# Patient Record
Sex: Female | Born: 1940 | Race: White | Hispanic: No | Smoking: Former smoker
Health system: Southern US, Community
[De-identification: ages and names within clinical notes are randomized; demographics above are authoritative.]

## PROBLEM LIST (undated history)

## (undated) DIAGNOSIS — M25371 Other instability, right ankle: Secondary | ICD-10-CM

## (undated) DIAGNOSIS — Z96652 Presence of left artificial knee joint: Secondary | ICD-10-CM

## (undated) DIAGNOSIS — D510 Vitamin B12 deficiency anemia due to intrinsic factor deficiency: Secondary | ICD-10-CM

## (undated) DIAGNOSIS — E538 Deficiency of other specified B group vitamins: Secondary | ICD-10-CM

## (undated) DIAGNOSIS — M1710 Unilateral primary osteoarthritis, unspecified knee: Secondary | ICD-10-CM

## (undated) DIAGNOSIS — R0989 Other specified symptoms and signs involving the circulatory and respiratory systems: Secondary | ICD-10-CM

## (undated) DIAGNOSIS — R079 Chest pain, unspecified: Secondary | ICD-10-CM

## (undated) DIAGNOSIS — H8110 Benign paroxysmal vertigo, unspecified ear: Secondary | ICD-10-CM

## (undated) DIAGNOSIS — E7800 Pure hypercholesterolemia, unspecified: Secondary | ICD-10-CM

## (undated) DIAGNOSIS — K219 Gastro-esophageal reflux disease without esophagitis: Secondary | ICD-10-CM

## (undated) DIAGNOSIS — M25473 Effusion, unspecified ankle: Secondary | ICD-10-CM

## (undated) DIAGNOSIS — M1990 Unspecified osteoarthritis, unspecified site: Secondary | ICD-10-CM

## (undated) DIAGNOSIS — I10 Essential (primary) hypertension: Secondary | ICD-10-CM

## (undated) DIAGNOSIS — M2170 Unequal limb length (acquired), unspecified site: Secondary | ICD-10-CM

## (undated) DIAGNOSIS — G609 Hereditary and idiopathic neuropathy, unspecified: Secondary | ICD-10-CM

## (undated) DIAGNOSIS — E041 Nontoxic single thyroid nodule: Secondary | ICD-10-CM

## (undated) DIAGNOSIS — S62109A Fracture of unspecified carpal bone, unspecified wrist, initial encounter for closed fracture: Secondary | ICD-10-CM

## (undated) DIAGNOSIS — M5412 Radiculopathy, cervical region: Secondary | ICD-10-CM

## (undated) DIAGNOSIS — M169 Osteoarthritis of hip, unspecified: Secondary | ICD-10-CM

## (undated) DIAGNOSIS — I499 Cardiac arrhythmia, unspecified: Secondary | ICD-10-CM

## (undated) DIAGNOSIS — R1310 Dysphagia, unspecified: Secondary | ICD-10-CM

## (undated) DIAGNOSIS — N183 Chronic kidney disease, stage 3 (moderate): Secondary | ICD-10-CM

## (undated) DIAGNOSIS — E785 Hyperlipidemia, unspecified: Secondary | ICD-10-CM

## (undated) DIAGNOSIS — E669 Obesity, unspecified: Secondary | ICD-10-CM

## (undated) DIAGNOSIS — G9589 Other specified diseases of spinal cord: Secondary | ICD-10-CM

## (undated) DIAGNOSIS — R739 Hyperglycemia, unspecified: Secondary | ICD-10-CM

## (undated) DIAGNOSIS — I441 Atrioventricular block, second degree: Secondary | ICD-10-CM

## (undated) HISTORY — DX: Pure hypercholesterolemia, unspecified: E78.00

## (undated) HISTORY — DX: Other instability, right ankle: M25.371

## (undated) HISTORY — DX: Deficiency of other specified B group vitamins: E53.8

## (undated) HISTORY — DX: Other specified symptoms and signs involving the circulatory and respiratory systems: R09.89

## (undated) HISTORY — DX: Effusion, unspecified ankle: M25.473

## (undated) HISTORY — DX: Hyperlipidemia, unspecified: E78.5

## (undated) HISTORY — DX: Dysphagia, unspecified: R13.10

## (undated) HISTORY — DX: Osteoarthritis of hip, unspecified: M16.9

## (undated) HISTORY — DX: Essential (primary) hypertension: I10

## (undated) HISTORY — DX: Hyperglycemia, unspecified: R73.9

## (undated) HISTORY — DX: Unequal limb length (acquired), unspecified site: M21.70

## (undated) HISTORY — DX: Presence of left artificial knee joint: Z96.652

## (undated) HISTORY — DX: Radiculopathy, cervical region: M54.12

## (undated) HISTORY — DX: Obesity, unspecified: E66.9

## (undated) HISTORY — DX: Chronic kidney disease, stage 3 (moderate): N18.3

## (undated) HISTORY — DX: Unspecified osteoarthritis, unspecified site: M19.90

## (undated) HISTORY — DX: Gastro-esophageal reflux disease without esophagitis: K21.9

## (undated) HISTORY — PX: ABDOMINAL HYSTERECTOMY: SHX81

## (undated) HISTORY — DX: Cardiac arrhythmia, unspecified: I49.9

## (undated) HISTORY — DX: Unilateral primary osteoarthritis, unspecified knee: M17.10

## (undated) HISTORY — DX: Atrioventricular block, second degree: I44.1

## (undated) HISTORY — DX: Vitamin B12 deficiency anemia due to intrinsic factor deficiency: D51.0

## (undated) HISTORY — DX: Hereditary and idiopathic neuropathy, unspecified: G60.9

## (undated) HISTORY — DX: Nontoxic single thyroid nodule: E04.1

## (undated) HISTORY — DX: Fracture of unspecified carpal bone, unspecified wrist, initial encounter for closed fracture: S62.109A

## (undated) HISTORY — DX: Other specified diseases of spinal cord: G95.89

## (undated) HISTORY — DX: Benign paroxysmal vertigo, unspecified ear: H81.10

## (undated) HISTORY — DX: Chest pain, unspecified: R07.9

---

## 2001-05-16 DIAGNOSIS — S62109A Fracture of unspecified carpal bone, unspecified wrist, initial encounter for closed fracture: Secondary | ICD-10-CM

## 2001-05-16 HISTORY — DX: Fracture of unspecified carpal bone, unspecified wrist, initial encounter for closed fracture: S62.109A

## 2007-05-17 HISTORY — PX: KNEE ARTHROSCOPY: SUR90

## 2008-01-30 ENCOUNTER — Inpatient Hospital Stay (HOSPITAL_COMMUNITY): Admission: RE | Admit: 2008-01-30 | Discharge: 2008-02-04 | Payer: Self-pay | Admitting: Orthopedic Surgery

## 2008-02-01 ENCOUNTER — Encounter (INDEPENDENT_AMBULATORY_CARE_PROVIDER_SITE_OTHER): Payer: Self-pay | Admitting: Orthopedic Surgery

## 2008-02-01 ENCOUNTER — Ambulatory Visit: Payer: Self-pay | Admitting: *Deleted

## 2008-02-01 HISTORY — PX: TOTAL HIP ARTHROPLASTY: SHX124

## 2010-11-28 NOTE — Discharge Summary (Signed)
Debra Harrison, [place] NO.:  [id number]   MEDICAL RECORD NO.:  [id number]          PATIENT TYPE:  INP   LOCATION:  5016                         FACILITY:  [facility]   PHYSICIAN:  Dyke Brackett, M.D.    DATE OF BIRTH:  Dec 12, 1940   DATE OF ADMISSION:  01/30/2008  DATE OF DISCHARGE:                               DISCHARGE SUMMARY   ADMISSION DIAGNOSIS:  Right hip osteoarthritis.   POSTOPERATIVE DIAGNOSIS:  Right hip osteoarthritis.   PROCEDURAL NOTE:  The patient underwent a right total hip replacement on  January 30, 2008, done by Dr. Madelon Lips, Kateri Plummer, PA, assisting.  Was  stable in the PACU and admitted to 5000 on January 30, 2008.   HOSPITAL COURSE:  The patient was admitted on January 30, 2008, to 5000  following a right total hip replacement.  The patient was stable and  admitted to the floor.  On January 31, 2008, the patient was rounded on by  orthopedics, was doing well at that point.  Her vitals were stable and  blood pressure a little on the lower end at 92/63 but still stable.  CBC  within normal limits and so was the patient's BMP.  The patient was  complaining of some calf pain.  They ordered Robaxin despite the fact  that the patient had an allergy to Robaxin.  That was not continued.  Still awaiting discharge to the skilled nursing facility Monday or  Tuesday.  The patient was to be evaluated by PT and OT, was  weightbearing as tolerated on the right lower extremity.  Dr. Barry Dienes was  notified about the patient's lower blood pressure and pain in the calves  and recommended Robaxin.  Once again noted that the patient had an  allergy and did not take that medicine.  The patient's blood pressure  lowered overnight on February 01, 2008, at 90/60.  Hemoglobin was 9.3.  Otherwise, lab work was within normal limits.  The patient was having  still a lot of calf pain, so we did a venous Doppler ultrasound on February 01, 2008, which came out as negative.  We continued to  monitor her calf  pain.  Due to her becoming orthostatic overnight on February 01, 2008, she  was given an IV bolus of fluid and rechecked on the morning of February 02, 2008.  The patient continued weightbearing as tolerated.  Noted on February 02, 2008, the patient had a hemoglobin of 8.8.  Decided to transfuse 2  units of packed red blood cells.  The patient tolerated that well.  We  were able to discontinue IV fluids.  Stated she was feeling well.  Continued to monitor.  Vital signs were stable following IV fluid bolus  and also CBC following transfusion of 2 units of packed red blood cells.  Noted her blood pressure was 125/70 in the morning on February 02, 2008.  On  February 03, 2008, the patient was still doing well.  Hemoglobin was up to  10.2, white blood cell 4.8, hematocrit 30.1, platelets 205.  Her vital  signs  were stable.  Her blood pressure was 122/71, and 95% oxygen on  room air.  BMP was within normal limits.  The patient's pain was very  mild.  The calf pain was improving.  As noted before, had a negative  Doppler ultrasound.  Right lower extremity neurovascularly intact.  The  patient was ready to go to the skilled nursing facility, to continue  weightbearing as tolerated on the right lower extremity.  The plan was  for discharge to skilled nursing facility on February 04, 2008, pending  vitals being stable and lab work within normal limits.   ASSESSMENT/PLAN:  The patient is 4 days postoperative right total hip  replacement with plan for discharge on postoperative day 5 as there is  apparently no bed available at skilled nursing.  The patient will be  discharged on a regular diet, will be weightbearing as tolerated on the  right lower extremity.   CONDITION:  Stable on discharge.   DISCHARGE MEDICATIONS:  1. She will be discharged on Diovan/hydrochlorothiazide 320/25 mg to      be taken 1 tablet p.o. daily.  2. Tramadol 50 mg 1 tablet p.o. q.4-6h. p.r.n. pain as needed.  3. Lovenox 40  mg subcu q.24h. at 8:00 a.m.  She will be needing that      for another 10 days, which will go to February 14, 2008, which will be      her last day.  4. The patient will continue on Colace 100 mg p.o. b.i.d.  5. Continue Percocet 5/325 mg 1 to 2 tablets p.o. q.4-6h. p.r.n. pain.  6. Continue with Senokot-S p.o. p.r.n.  7. Continue Fleet's enema p.r.n.  8. Continue with Tylenol 325 to 650 mg 1 tablet p.o. q.4-6h. p.r.n.      pain, not to exceed 4000 mg per day.  9. Continue Zofran 4 mg p.o. q.8h. p.r.n. nausea, vomiting.   Plan for followup in the office with Dr. Madelon Lips in about 2 weeks  following admission to skilled nursing facility.  Please call [phone]  for followup appointment at Dr. Candise Bowens office.  Plan for patient to  have staples removed postop day number 14, which will be about February 14, 2008.      Sharol Given, PA      Dyke Brackett, M.D.  Electronically Signed    JBS/MEDQ  D:  02/03/2008  T:  02/03/2008  Job:  [id number]

## 2010-11-28 NOTE — Op Note (Signed)
NAME:  Debra Harrison, Debra Harrison NO.:  [id number]   MEDICAL RECORD NO.:  [id number]          PATIENT TYPE:  INP   LOCATION:  5016                         FACILITY:  [facility]   PHYSICIAN:  Dyke Brackett, M.D.    DATE OF BIRTH:  Jun 19, 1941   DATE OF PROCEDURE:  DATE OF DISCHARGE:                               OPERATIVE REPORT   PREOPERATIVE DIAGNOSIS:  Severe osteoarthritis, right hip.   POSTOPERATIVE DIAGNOSIS:  Severe osteoarthritis, right hip.   OPERATION:  Right total hip replacement (AML small stature of 15-mm plus  1.5-mm neck length with 56 mL acetabulum with Pinnacle cup 10-degree  liner.   SURGEON:  Dyke Brackett, MD.   ASSISTANT:  Sharol Given, PA   BLOOD LOSS:  Approximately 300.   DESCRIPTION OF PROCEDURE:  Lateral position with a posterior approach to  the hip made splitting the iliotibial band and short external rotators  of the hip, a T-capsulotomy made in the hip.  Severe arthritis was  encountered with almost of all the effusion to the pelvis with pretty  significant erosion into the acetabulum.  Head was cut about one  fingerbreadth above the lesser troch followed by progressive reaming and  then rasping to accept a-15 mm small stature stem.  This was next  directed to the acetabulum.  There was somewhat of an oblong acetabulum  with an hourglass shape to it.  This was progressively reamed up after  the appropriate retractors were placed for initially plan of a +1-mm  reaming.  With the trial being placed, through this bottomed out and  therefore was eventually a 4-mm under reaming was done to allow some  purchase of the cup.  A 3-hole cup was used with three screws being  placed as well in the superior weightbearing aspect of the acetabulum  for better stability and a trial of poly was placed into the final cup  and then the rasp with the trial femur placed.  A 36-mm hip ball was  used with a +5-mm neck length with restored leg lengths nicely.  Excellent stability was noted with little if any tendency to dislocate.  The trials were removed followed by placement of the final poly and the  final prosthesis on the femur and then another trial carried out with  the +5 1.5-mm neck length.  The final ball was placed and excellent  stability noted.  Closure of the capsule was done with #1 Ethibond on  the fascia.  Fascial closure and then number one 2-0 Vicryl on the  subcutaneous tissues.  Lightly compressive sterile dressing applied and  taken to recovery room in stable condition.      Dyke Brackett, M.D.  Electronically Signed     WDC/MEDQ  D:  01/30/2008  T:  01/31/2008  Job:  [id number]

## 2011-04-12 LAB — URINALYSIS, ROUTINE W REFLEX MICROSCOPIC
Bilirubin Urine: NEGATIVE
Glucose, UA: NEGATIVE
Hgb urine dipstick: NEGATIVE
Protein, ur: NEGATIVE
Urobilinogen, UA: 0.2

## 2011-04-12 LAB — URINE CULTURE: Colony Count: >=100000

## 2011-04-12 LAB — URINE MICROSCOPIC-ADD ON

## 2011-04-12 LAB — TYPE AND SCREEN
DAT, IgG: NEGATIVE
Donor AG Type: NEGATIVE

## 2011-04-12 LAB — DIFFERENTIAL
Basophils Relative: 0
Eosinophils Absolute: 0.1
Eosinophils Relative: 1
Neutrophils Relative %: 66

## 2011-04-12 LAB — CBC
Hemoglobin: 13.2
MCHC: 34.7
RBC: 4.13
WBC: 5.2

## 2011-04-12 LAB — COMPREHENSIVE METABOLIC PANEL
ALT: 16
Alkaline Phosphatase: 65
CO2: 28
Calcium: 10.1
Chloride: 104
GFR calc non Af Amer: >60
Glucose, Bld: 109 — ABNORMAL HIGH
Potassium: 3.9
Sodium: 140
Total Bilirubin: 0.8

## 2011-04-12 LAB — PROTIME-INR
INR: 1
Prothrombin Time: 12.9

## 2011-04-13 LAB — BASIC METABOLIC PANEL
BUN: 8
BUN: 9
CO2: 29
CO2: 33 — ABNORMAL HIGH
Calcium: 8.7
Calcium: 8.7
Chloride: 102
Chloride: 102
Chloride: 105
Chloride: 106
Creatinine, Ser: 0.61
Creatinine, Ser: 0.72
Creatinine, Ser: 0.75
GFR calc Af Amer: >60
GFR calc Af Amer: >60
GFR calc Af Amer: >60
Glucose, Bld: 127 — ABNORMAL HIGH
Potassium: 4.2
Potassium: 4.3
Sodium: 140

## 2011-04-13 LAB — CBC
HCT: 28 — ABNORMAL LOW
HCT: 29.7 — ABNORMAL LOW
HCT: 30.1 — ABNORMAL LOW
Hemoglobin: 10.2 — ABNORMAL LOW
Hemoglobin: 8.8 — ABNORMAL LOW
MCHC: 34.4
MCHC: 35.1
MCHC: 35.5
MCV: 89.6
MCV: 91
MCV: 91.9
MCV: 92.2
Platelets: 190
Platelets: 225
Platelets: 236
RBC: 2.79 — ABNORMAL LOW
RBC: 2.94 — ABNORMAL LOW
RBC: 3.31 — ABNORMAL LOW
RDW: 13.1
RDW: 14.1
WBC: 4.8
WBC: 5.4
WBC: 5.8
WBC: 5.9

## 2011-04-13 LAB — TYPE AND SCREEN
ABO/RH(D): O NEG
Antibody Screen: POSITIVE
Donor AG Type: NEGATIVE

## 2011-04-13 LAB — PREPARE RBC (CROSSMATCH)

## 2011-11-30 DIAGNOSIS — R079 Chest pain, unspecified: Secondary | ICD-10-CM | POA: Diagnosis not present

## 2011-12-25 DIAGNOSIS — M7750 Other enthesopathy of unspecified foot: Secondary | ICD-10-CM | POA: Diagnosis not present

## 2011-12-25 DIAGNOSIS — M722 Plantar fascial fibromatosis: Secondary | ICD-10-CM | POA: Diagnosis not present

## 2012-01-15 DIAGNOSIS — M7750 Other enthesopathy of unspecified foot: Secondary | ICD-10-CM | POA: Diagnosis not present

## 2012-01-15 DIAGNOSIS — M722 Plantar fascial fibromatosis: Secondary | ICD-10-CM | POA: Diagnosis not present

## 2012-04-22 DIAGNOSIS — Z79899 Other long term (current) drug therapy: Secondary | ICD-10-CM | POA: Diagnosis not present

## 2012-04-22 DIAGNOSIS — E7800 Pure hypercholesterolemia, unspecified: Secondary | ICD-10-CM | POA: Diagnosis not present

## 2012-04-22 DIAGNOSIS — Z23 Encounter for immunization: Secondary | ICD-10-CM | POA: Diagnosis not present

## 2012-04-22 DIAGNOSIS — Z1239 Encounter for other screening for malignant neoplasm of breast: Secondary | ICD-10-CM | POA: Diagnosis not present

## 2012-04-22 DIAGNOSIS — I10 Essential (primary) hypertension: Secondary | ICD-10-CM | POA: Diagnosis not present

## 2012-04-30 DIAGNOSIS — Z1231 Encounter for screening mammogram for malignant neoplasm of breast: Secondary | ICD-10-CM | POA: Diagnosis not present

## 2012-12-18 DIAGNOSIS — I10 Essential (primary) hypertension: Secondary | ICD-10-CM | POA: Diagnosis not present

## 2012-12-18 DIAGNOSIS — R609 Edema, unspecified: Secondary | ICD-10-CM | POA: Diagnosis not present

## 2012-12-18 DIAGNOSIS — Z79899 Other long term (current) drug therapy: Secondary | ICD-10-CM | POA: Diagnosis not present

## 2013-01-01 DIAGNOSIS — N183 Chronic kidney disease, stage 3 unspecified: Secondary | ICD-10-CM | POA: Diagnosis not present

## 2013-01-01 DIAGNOSIS — R609 Edema, unspecified: Secondary | ICD-10-CM | POA: Diagnosis not present

## 2013-01-01 DIAGNOSIS — I10 Essential (primary) hypertension: Secondary | ICD-10-CM | POA: Diagnosis not present

## 2013-01-02 DIAGNOSIS — E7800 Pure hypercholesterolemia, unspecified: Secondary | ICD-10-CM | POA: Diagnosis not present

## 2013-01-02 DIAGNOSIS — I10 Essential (primary) hypertension: Secondary | ICD-10-CM | POA: Diagnosis not present

## 2013-01-07 DIAGNOSIS — N281 Cyst of kidney, acquired: Secondary | ICD-10-CM | POA: Diagnosis not present

## 2013-01-07 DIAGNOSIS — R1011 Right upper quadrant pain: Secondary | ICD-10-CM | POA: Diagnosis not present

## 2013-01-19 DIAGNOSIS — M949 Disorder of cartilage, unspecified: Secondary | ICD-10-CM | POA: Diagnosis not present

## 2013-01-19 DIAGNOSIS — Z780 Asymptomatic menopausal state: Secondary | ICD-10-CM | POA: Diagnosis not present

## 2013-01-19 DIAGNOSIS — I10 Essential (primary) hypertension: Secondary | ICD-10-CM | POA: Diagnosis not present

## 2013-01-19 DIAGNOSIS — M899 Disorder of bone, unspecified: Secondary | ICD-10-CM | POA: Diagnosis not present

## 2013-01-19 DIAGNOSIS — Z23 Encounter for immunization: Secondary | ICD-10-CM | POA: Diagnosis not present

## 2013-01-19 DIAGNOSIS — Z79899 Other long term (current) drug therapy: Secondary | ICD-10-CM | POA: Diagnosis not present

## 2013-01-19 DIAGNOSIS — R609 Edema, unspecified: Secondary | ICD-10-CM | POA: Diagnosis not present

## 2013-02-16 DIAGNOSIS — M1710 Unilateral primary osteoarthritis, unspecified knee: Secondary | ICD-10-CM | POA: Diagnosis not present

## 2013-02-23 DIAGNOSIS — M1710 Unilateral primary osteoarthritis, unspecified knee: Secondary | ICD-10-CM | POA: Diagnosis not present

## 2013-03-02 DIAGNOSIS — M1710 Unilateral primary osteoarthritis, unspecified knee: Secondary | ICD-10-CM | POA: Diagnosis not present

## 2013-05-19 DIAGNOSIS — Z1231 Encounter for screening mammogram for malignant neoplasm of breast: Secondary | ICD-10-CM | POA: Diagnosis not present

## 2013-08-06 DIAGNOSIS — R062 Wheezing: Secondary | ICD-10-CM | POA: Diagnosis not present

## 2013-08-06 DIAGNOSIS — J209 Acute bronchitis, unspecified: Secondary | ICD-10-CM | POA: Diagnosis not present

## 2013-10-07 DIAGNOSIS — R079 Chest pain, unspecified: Secondary | ICD-10-CM | POA: Diagnosis not present

## 2014-04-13 DIAGNOSIS — M722 Plantar fascial fibromatosis: Secondary | ICD-10-CM | POA: Diagnosis not present

## 2014-04-13 DIAGNOSIS — M6240 Contracture of muscle, unspecified site: Secondary | ICD-10-CM | POA: Diagnosis not present

## 2014-05-18 DIAGNOSIS — M722 Plantar fascial fibromatosis: Secondary | ICD-10-CM | POA: Diagnosis not present

## 2014-05-18 DIAGNOSIS — M6702 Short Achilles tendon (acquired), left ankle: Secondary | ICD-10-CM | POA: Diagnosis not present

## 2014-08-24 DIAGNOSIS — I10 Essential (primary) hypertension: Secondary | ICD-10-CM | POA: Diagnosis not present

## 2014-08-24 DIAGNOSIS — Z79899 Other long term (current) drug therapy: Secondary | ICD-10-CM | POA: Diagnosis not present

## 2014-08-24 DIAGNOSIS — E669 Obesity, unspecified: Secondary | ICD-10-CM | POA: Diagnosis not present

## 2014-08-24 DIAGNOSIS — E780 Pure hypercholesterolemia: Secondary | ICD-10-CM | POA: Diagnosis not present

## 2014-08-24 DIAGNOSIS — Z0000 Encounter for general adult medical examination without abnormal findings: Secondary | ICD-10-CM | POA: Diagnosis not present

## 2014-08-24 DIAGNOSIS — Z23 Encounter for immunization: Secondary | ICD-10-CM | POA: Diagnosis not present

## 2014-08-31 DIAGNOSIS — Z1231 Encounter for screening mammogram for malignant neoplasm of breast: Secondary | ICD-10-CM | POA: Diagnosis not present

## 2014-10-07 DIAGNOSIS — M170 Bilateral primary osteoarthritis of knee: Secondary | ICD-10-CM | POA: Diagnosis not present

## 2014-10-14 DIAGNOSIS — M170 Bilateral primary osteoarthritis of knee: Secondary | ICD-10-CM | POA: Diagnosis not present

## 2014-10-21 DIAGNOSIS — M170 Bilateral primary osteoarthritis of knee: Secondary | ICD-10-CM | POA: Diagnosis not present

## 2014-11-08 DIAGNOSIS — N183 Chronic kidney disease, stage 3 (moderate): Secondary | ICD-10-CM | POA: Diagnosis not present

## 2014-11-08 DIAGNOSIS — I10 Essential (primary) hypertension: Secondary | ICD-10-CM | POA: Diagnosis not present

## 2015-01-31 DIAGNOSIS — I10 Essential (primary) hypertension: Secondary | ICD-10-CM | POA: Diagnosis not present

## 2015-01-31 DIAGNOSIS — R312 Other microscopic hematuria: Secondary | ICD-10-CM | POA: Diagnosis not present

## 2015-01-31 DIAGNOSIS — R1084 Generalized abdominal pain: Secondary | ICD-10-CM | POA: Diagnosis not present

## 2015-01-31 DIAGNOSIS — R1310 Dysphagia, unspecified: Secondary | ICD-10-CM | POA: Diagnosis not present

## 2015-02-04 DIAGNOSIS — N281 Cyst of kidney, acquired: Secondary | ICD-10-CM | POA: Diagnosis not present

## 2015-02-04 DIAGNOSIS — R1011 Right upper quadrant pain: Secondary | ICD-10-CM | POA: Diagnosis not present

## 2015-12-16 DIAGNOSIS — R739 Hyperglycemia, unspecified: Secondary | ICD-10-CM | POA: Diagnosis not present

## 2015-12-16 DIAGNOSIS — Z79899 Other long term (current) drug therapy: Secondary | ICD-10-CM | POA: Diagnosis not present

## 2015-12-16 DIAGNOSIS — N183 Chronic kidney disease, stage 3 (moderate): Secondary | ICD-10-CM | POA: Diagnosis not present

## 2015-12-16 DIAGNOSIS — I10 Essential (primary) hypertension: Secondary | ICD-10-CM | POA: Diagnosis not present

## 2015-12-16 DIAGNOSIS — E785 Hyperlipidemia, unspecified: Secondary | ICD-10-CM | POA: Diagnosis not present

## 2015-12-16 DIAGNOSIS — K219 Gastro-esophageal reflux disease without esophagitis: Secondary | ICD-10-CM | POA: Diagnosis not present

## 2015-12-23 DIAGNOSIS — Z1231 Encounter for screening mammogram for malignant neoplasm of breast: Secondary | ICD-10-CM | POA: Diagnosis not present

## 2016-01-23 DIAGNOSIS — M7061 Trochanteric bursitis, right hip: Secondary | ICD-10-CM | POA: Diagnosis not present

## 2016-01-23 DIAGNOSIS — M1712 Unilateral primary osteoarthritis, left knee: Secondary | ICD-10-CM | POA: Diagnosis not present

## 2016-01-30 DIAGNOSIS — M1712 Unilateral primary osteoarthritis, left knee: Secondary | ICD-10-CM | POA: Diagnosis not present

## 2016-02-21 DIAGNOSIS — M7062 Trochanteric bursitis, left hip: Secondary | ICD-10-CM | POA: Diagnosis not present

## 2016-02-28 DIAGNOSIS — M7061 Trochanteric bursitis, right hip: Secondary | ICD-10-CM | POA: Diagnosis not present

## 2016-02-28 DIAGNOSIS — M1712 Unilateral primary osteoarthritis, left knee: Secondary | ICD-10-CM | POA: Diagnosis not present

## 2016-02-28 DIAGNOSIS — M25552 Pain in left hip: Secondary | ICD-10-CM | POA: Diagnosis not present

## 2016-03-20 DIAGNOSIS — M1712 Unilateral primary osteoarthritis, left knee: Secondary | ICD-10-CM | POA: Diagnosis not present

## 2016-03-20 DIAGNOSIS — M7061 Trochanteric bursitis, right hip: Secondary | ICD-10-CM | POA: Diagnosis not present

## 2016-03-20 DIAGNOSIS — M25552 Pain in left hip: Secondary | ICD-10-CM | POA: Diagnosis not present

## 2016-03-26 DIAGNOSIS — M1712 Unilateral primary osteoarthritis, left knee: Secondary | ICD-10-CM | POA: Diagnosis not present

## 2016-03-26 DIAGNOSIS — M25552 Pain in left hip: Secondary | ICD-10-CM | POA: Diagnosis not present

## 2016-03-26 DIAGNOSIS — M7061 Trochanteric bursitis, right hip: Secondary | ICD-10-CM | POA: Diagnosis not present

## 2016-05-01 DIAGNOSIS — M1712 Unilateral primary osteoarthritis, left knee: Secondary | ICD-10-CM | POA: Diagnosis not present

## 2016-05-02 DIAGNOSIS — Z01818 Encounter for other preprocedural examination: Secondary | ICD-10-CM | POA: Diagnosis not present

## 2016-05-02 DIAGNOSIS — M79609 Pain in unspecified limb: Secondary | ICD-10-CM | POA: Diagnosis not present

## 2016-05-02 DIAGNOSIS — Z79899 Other long term (current) drug therapy: Secondary | ICD-10-CM | POA: Diagnosis not present

## 2016-05-02 DIAGNOSIS — Z79890 Hormone replacement therapy (postmenopausal): Secondary | ICD-10-CM | POA: Diagnosis not present

## 2016-05-02 DIAGNOSIS — R52 Pain, unspecified: Secondary | ICD-10-CM | POA: Diagnosis not present

## 2016-05-09 DIAGNOSIS — M1712 Unilateral primary osteoarthritis, left knee: Secondary | ICD-10-CM | POA: Diagnosis not present

## 2016-05-09 DIAGNOSIS — Z9181 History of falling: Secondary | ICD-10-CM | POA: Diagnosis not present

## 2016-05-21 DIAGNOSIS — M1712 Unilateral primary osteoarthritis, left knee: Secondary | ICD-10-CM | POA: Diagnosis not present

## 2016-05-30 DIAGNOSIS — E7800 Pure hypercholesterolemia, unspecified: Secondary | ICD-10-CM | POA: Diagnosis present

## 2016-05-30 DIAGNOSIS — I10 Essential (primary) hypertension: Secondary | ICD-10-CM | POA: Diagnosis present

## 2016-05-30 DIAGNOSIS — Z96652 Presence of left artificial knee joint: Secondary | ICD-10-CM | POA: Diagnosis not present

## 2016-05-30 DIAGNOSIS — Z79899 Other long term (current) drug therapy: Secondary | ICD-10-CM | POA: Diagnosis not present

## 2016-05-30 DIAGNOSIS — G8918 Other acute postprocedural pain: Secondary | ICD-10-CM | POA: Diagnosis not present

## 2016-05-30 DIAGNOSIS — Z471 Aftercare following joint replacement surgery: Secondary | ICD-10-CM | POA: Diagnosis not present

## 2016-05-30 DIAGNOSIS — E669 Obesity, unspecified: Secondary | ICD-10-CM | POA: Diagnosis present

## 2016-05-30 DIAGNOSIS — Z6833 Body mass index (BMI) 33.0-33.9, adult: Secondary | ICD-10-CM | POA: Diagnosis not present

## 2016-05-30 DIAGNOSIS — Z87891 Personal history of nicotine dependence: Secondary | ICD-10-CM | POA: Diagnosis not present

## 2016-05-30 DIAGNOSIS — M1712 Unilateral primary osteoarthritis, left knee: Secondary | ICD-10-CM | POA: Diagnosis not present

## 2016-05-30 DIAGNOSIS — Z23 Encounter for immunization: Secondary | ICD-10-CM | POA: Diagnosis not present

## 2016-05-30 DIAGNOSIS — D649 Anemia, unspecified: Secondary | ICD-10-CM | POA: Diagnosis present

## 2016-05-30 HISTORY — PX: OTHER SURGICAL HISTORY: SHX169

## 2016-06-02 DIAGNOSIS — Z79899 Other long term (current) drug therapy: Secondary | ICD-10-CM | POA: Diagnosis not present

## 2016-06-02 DIAGNOSIS — I10 Essential (primary) hypertension: Secondary | ICD-10-CM | POA: Diagnosis not present

## 2016-06-02 DIAGNOSIS — E669 Obesity, unspecified: Secondary | ICD-10-CM | POA: Diagnosis not present

## 2016-06-02 DIAGNOSIS — D649 Anemia, unspecified: Secondary | ICD-10-CM | POA: Diagnosis not present

## 2016-06-02 DIAGNOSIS — Z6833 Body mass index (BMI) 33.0-33.9, adult: Secondary | ICD-10-CM | POA: Diagnosis not present

## 2016-06-02 DIAGNOSIS — R262 Difficulty in walking, not elsewhere classified: Secondary | ICD-10-CM | POA: Diagnosis not present

## 2016-06-02 DIAGNOSIS — Z471 Aftercare following joint replacement surgery: Secondary | ICD-10-CM | POA: Diagnosis not present

## 2016-06-02 DIAGNOSIS — Z87891 Personal history of nicotine dependence: Secondary | ICD-10-CM | POA: Diagnosis not present

## 2016-06-02 DIAGNOSIS — G8918 Other acute postprocedural pain: Secondary | ICD-10-CM | POA: Diagnosis not present

## 2016-06-02 DIAGNOSIS — M1712 Unilateral primary osteoarthritis, left knee: Secondary | ICD-10-CM | POA: Diagnosis not present

## 2016-06-02 DIAGNOSIS — Z23 Encounter for immunization: Secondary | ICD-10-CM | POA: Diagnosis not present

## 2016-06-02 DIAGNOSIS — E7800 Pure hypercholesterolemia, unspecified: Secondary | ICD-10-CM | POA: Diagnosis not present

## 2016-06-02 DIAGNOSIS — Z96652 Presence of left artificial knee joint: Secondary | ICD-10-CM | POA: Diagnosis not present

## 2016-06-03 DIAGNOSIS — G8918 Other acute postprocedural pain: Secondary | ICD-10-CM | POA: Diagnosis not present

## 2016-06-03 DIAGNOSIS — R262 Difficulty in walking, not elsewhere classified: Secondary | ICD-10-CM | POA: Diagnosis not present

## 2016-06-03 DIAGNOSIS — Z471 Aftercare following joint replacement surgery: Secondary | ICD-10-CM | POA: Diagnosis not present

## 2016-06-03 DIAGNOSIS — D649 Anemia, unspecified: Secondary | ICD-10-CM | POA: Diagnosis not present

## 2016-06-13 DIAGNOSIS — Z96652 Presence of left artificial knee joint: Secondary | ICD-10-CM | POA: Diagnosis not present

## 2016-06-13 DIAGNOSIS — I10 Essential (primary) hypertension: Secondary | ICD-10-CM | POA: Diagnosis not present

## 2016-06-13 DIAGNOSIS — Z471 Aftercare following joint replacement surgery: Secondary | ICD-10-CM | POA: Diagnosis not present

## 2016-06-13 DIAGNOSIS — Z7982 Long term (current) use of aspirin: Secondary | ICD-10-CM | POA: Diagnosis not present

## 2016-06-14 DIAGNOSIS — Z96652 Presence of left artificial knee joint: Secondary | ICD-10-CM | POA: Diagnosis not present

## 2016-06-14 DIAGNOSIS — Z471 Aftercare following joint replacement surgery: Secondary | ICD-10-CM | POA: Diagnosis not present

## 2016-06-14 DIAGNOSIS — Z7982 Long term (current) use of aspirin: Secondary | ICD-10-CM | POA: Diagnosis not present

## 2016-06-14 DIAGNOSIS — I10 Essential (primary) hypertension: Secondary | ICD-10-CM | POA: Diagnosis not present

## 2016-06-18 DIAGNOSIS — M5416 Radiculopathy, lumbar region: Secondary | ICD-10-CM | POA: Diagnosis not present

## 2016-06-19 DIAGNOSIS — Z96652 Presence of left artificial knee joint: Secondary | ICD-10-CM | POA: Diagnosis not present

## 2016-06-19 DIAGNOSIS — Z7982 Long term (current) use of aspirin: Secondary | ICD-10-CM | POA: Diagnosis not present

## 2016-06-19 DIAGNOSIS — I10 Essential (primary) hypertension: Secondary | ICD-10-CM | POA: Diagnosis not present

## 2016-06-19 DIAGNOSIS — Z471 Aftercare following joint replacement surgery: Secondary | ICD-10-CM | POA: Diagnosis not present

## 2016-06-20 DIAGNOSIS — Z96652 Presence of left artificial knee joint: Secondary | ICD-10-CM | POA: Diagnosis not present

## 2016-06-20 DIAGNOSIS — Z7982 Long term (current) use of aspirin: Secondary | ICD-10-CM | POA: Diagnosis not present

## 2016-06-20 DIAGNOSIS — Z471 Aftercare following joint replacement surgery: Secondary | ICD-10-CM | POA: Diagnosis not present

## 2016-06-20 DIAGNOSIS — I10 Essential (primary) hypertension: Secondary | ICD-10-CM | POA: Diagnosis not present

## 2016-06-21 DIAGNOSIS — M5416 Radiculopathy, lumbar region: Secondary | ICD-10-CM | POA: Diagnosis not present

## 2016-06-21 DIAGNOSIS — M5137 Other intervertebral disc degeneration, lumbosacral region: Secondary | ICD-10-CM | POA: Diagnosis not present

## 2016-06-21 DIAGNOSIS — M5126 Other intervertebral disc displacement, lumbar region: Secondary | ICD-10-CM | POA: Diagnosis not present

## 2016-06-21 DIAGNOSIS — M5136 Other intervertebral disc degeneration, lumbar region: Secondary | ICD-10-CM | POA: Diagnosis not present

## 2016-06-22 DIAGNOSIS — Z96652 Presence of left artificial knee joint: Secondary | ICD-10-CM | POA: Diagnosis not present

## 2016-06-22 DIAGNOSIS — Z7982 Long term (current) use of aspirin: Secondary | ICD-10-CM | POA: Diagnosis not present

## 2016-06-22 DIAGNOSIS — Z471 Aftercare following joint replacement surgery: Secondary | ICD-10-CM | POA: Diagnosis not present

## 2016-06-22 DIAGNOSIS — I10 Essential (primary) hypertension: Secondary | ICD-10-CM | POA: Diagnosis not present

## 2016-06-25 DIAGNOSIS — Z7982 Long term (current) use of aspirin: Secondary | ICD-10-CM | POA: Diagnosis not present

## 2016-06-25 DIAGNOSIS — Z471 Aftercare following joint replacement surgery: Secondary | ICD-10-CM | POA: Diagnosis not present

## 2016-06-25 DIAGNOSIS — I10 Essential (primary) hypertension: Secondary | ICD-10-CM | POA: Diagnosis not present

## 2016-06-25 DIAGNOSIS — M5416 Radiculopathy, lumbar region: Secondary | ICD-10-CM | POA: Diagnosis not present

## 2016-06-25 DIAGNOSIS — Z96652 Presence of left artificial knee joint: Secondary | ICD-10-CM | POA: Diagnosis not present

## 2016-06-26 DIAGNOSIS — Z96652 Presence of left artificial knee joint: Secondary | ICD-10-CM | POA: Diagnosis not present

## 2016-06-26 DIAGNOSIS — M25562 Pain in left knee: Secondary | ICD-10-CM | POA: Diagnosis not present

## 2016-06-27 DIAGNOSIS — I10 Essential (primary) hypertension: Secondary | ICD-10-CM | POA: Diagnosis not present

## 2016-06-27 DIAGNOSIS — Z96652 Presence of left artificial knee joint: Secondary | ICD-10-CM | POA: Diagnosis not present

## 2016-06-27 DIAGNOSIS — Z471 Aftercare following joint replacement surgery: Secondary | ICD-10-CM | POA: Diagnosis not present

## 2016-06-27 DIAGNOSIS — Z7982 Long term (current) use of aspirin: Secondary | ICD-10-CM | POA: Diagnosis not present

## 2016-06-28 DIAGNOSIS — Z96652 Presence of left artificial knee joint: Secondary | ICD-10-CM | POA: Diagnosis not present

## 2016-06-28 DIAGNOSIS — R202 Paresthesia of skin: Secondary | ICD-10-CM | POA: Diagnosis not present

## 2016-06-28 DIAGNOSIS — G5791 Unspecified mononeuropathy of right lower limb: Secondary | ICD-10-CM | POA: Diagnosis not present

## 2016-06-28 DIAGNOSIS — M170 Bilateral primary osteoarthritis of knee: Secondary | ICD-10-CM | POA: Diagnosis not present

## 2016-06-29 DIAGNOSIS — Z96652 Presence of left artificial knee joint: Secondary | ICD-10-CM | POA: Diagnosis not present

## 2016-06-29 DIAGNOSIS — R2681 Unsteadiness on feet: Secondary | ICD-10-CM | POA: Diagnosis not present

## 2016-06-29 DIAGNOSIS — M25462 Effusion, left knee: Secondary | ICD-10-CM | POA: Diagnosis not present

## 2016-06-29 DIAGNOSIS — M25562 Pain in left knee: Secondary | ICD-10-CM | POA: Diagnosis not present

## 2016-06-29 DIAGNOSIS — M6281 Muscle weakness (generalized): Secondary | ICD-10-CM | POA: Diagnosis not present

## 2016-06-29 DIAGNOSIS — R208 Other disturbances of skin sensation: Secondary | ICD-10-CM | POA: Diagnosis not present

## 2016-06-29 DIAGNOSIS — R293 Abnormal posture: Secondary | ICD-10-CM | POA: Diagnosis not present

## 2016-06-29 DIAGNOSIS — M25662 Stiffness of left knee, not elsewhere classified: Secondary | ICD-10-CM | POA: Diagnosis not present

## 2016-06-29 DIAGNOSIS — R2689 Other abnormalities of gait and mobility: Secondary | ICD-10-CM | POA: Diagnosis not present

## 2016-07-02 DIAGNOSIS — M6281 Muscle weakness (generalized): Secondary | ICD-10-CM | POA: Diagnosis not present

## 2016-07-02 DIAGNOSIS — M25462 Effusion, left knee: Secondary | ICD-10-CM | POA: Diagnosis not present

## 2016-07-02 DIAGNOSIS — Z96652 Presence of left artificial knee joint: Secondary | ICD-10-CM | POA: Diagnosis not present

## 2016-07-02 DIAGNOSIS — R208 Other disturbances of skin sensation: Secondary | ICD-10-CM | POA: Diagnosis not present

## 2016-07-02 DIAGNOSIS — R2689 Other abnormalities of gait and mobility: Secondary | ICD-10-CM | POA: Diagnosis not present

## 2016-07-02 DIAGNOSIS — M25562 Pain in left knee: Secondary | ICD-10-CM | POA: Diagnosis not present

## 2016-07-04 DIAGNOSIS — M25462 Effusion, left knee: Secondary | ICD-10-CM | POA: Diagnosis not present

## 2016-07-04 DIAGNOSIS — M25562 Pain in left knee: Secondary | ICD-10-CM | POA: Diagnosis not present

## 2016-07-04 DIAGNOSIS — R208 Other disturbances of skin sensation: Secondary | ICD-10-CM | POA: Diagnosis not present

## 2016-07-04 DIAGNOSIS — R2689 Other abnormalities of gait and mobility: Secondary | ICD-10-CM | POA: Diagnosis not present

## 2016-07-04 DIAGNOSIS — Z96652 Presence of left artificial knee joint: Secondary | ICD-10-CM | POA: Diagnosis not present

## 2016-07-04 DIAGNOSIS — M6281 Muscle weakness (generalized): Secondary | ICD-10-CM | POA: Diagnosis not present

## 2016-07-05 DIAGNOSIS — R208 Other disturbances of skin sensation: Secondary | ICD-10-CM | POA: Diagnosis not present

## 2016-07-05 DIAGNOSIS — M25462 Effusion, left knee: Secondary | ICD-10-CM | POA: Diagnosis not present

## 2016-07-05 DIAGNOSIS — Z96652 Presence of left artificial knee joint: Secondary | ICD-10-CM | POA: Diagnosis not present

## 2016-07-05 DIAGNOSIS — R2689 Other abnormalities of gait and mobility: Secondary | ICD-10-CM | POA: Diagnosis not present

## 2016-07-05 DIAGNOSIS — M6281 Muscle weakness (generalized): Secondary | ICD-10-CM | POA: Diagnosis not present

## 2016-07-05 DIAGNOSIS — M25562 Pain in left knee: Secondary | ICD-10-CM | POA: Diagnosis not present

## 2016-07-10 DIAGNOSIS — M25562 Pain in left knee: Secondary | ICD-10-CM | POA: Diagnosis not present

## 2016-07-10 DIAGNOSIS — R2689 Other abnormalities of gait and mobility: Secondary | ICD-10-CM | POA: Diagnosis not present

## 2016-07-10 DIAGNOSIS — Z96652 Presence of left artificial knee joint: Secondary | ICD-10-CM | POA: Diagnosis not present

## 2016-07-10 DIAGNOSIS — M6281 Muscle weakness (generalized): Secondary | ICD-10-CM | POA: Diagnosis not present

## 2016-07-10 DIAGNOSIS — R208 Other disturbances of skin sensation: Secondary | ICD-10-CM | POA: Diagnosis not present

## 2016-07-10 DIAGNOSIS — M25462 Effusion, left knee: Secondary | ICD-10-CM | POA: Diagnosis not present

## 2016-07-12 DIAGNOSIS — Z96652 Presence of left artificial knee joint: Secondary | ICD-10-CM | POA: Diagnosis not present

## 2016-07-12 DIAGNOSIS — R208 Other disturbances of skin sensation: Secondary | ICD-10-CM | POA: Diagnosis not present

## 2016-07-12 DIAGNOSIS — R2689 Other abnormalities of gait and mobility: Secondary | ICD-10-CM | POA: Diagnosis not present

## 2016-07-12 DIAGNOSIS — M25462 Effusion, left knee: Secondary | ICD-10-CM | POA: Diagnosis not present

## 2016-07-12 DIAGNOSIS — G5791 Unspecified mononeuropathy of right lower limb: Secondary | ICD-10-CM | POA: Diagnosis not present

## 2016-07-12 DIAGNOSIS — R202 Paresthesia of skin: Secondary | ICD-10-CM | POA: Diagnosis not present

## 2016-07-12 DIAGNOSIS — M25562 Pain in left knee: Secondary | ICD-10-CM | POA: Diagnosis not present

## 2016-07-12 DIAGNOSIS — M170 Bilateral primary osteoarthritis of knee: Secondary | ICD-10-CM | POA: Diagnosis not present

## 2016-07-12 DIAGNOSIS — M6281 Muscle weakness (generalized): Secondary | ICD-10-CM | POA: Diagnosis not present

## 2016-07-17 DIAGNOSIS — R208 Other disturbances of skin sensation: Secondary | ICD-10-CM | POA: Diagnosis not present

## 2016-07-17 DIAGNOSIS — M6281 Muscle weakness (generalized): Secondary | ICD-10-CM | POA: Diagnosis not present

## 2016-07-17 DIAGNOSIS — R2689 Other abnormalities of gait and mobility: Secondary | ICD-10-CM | POA: Diagnosis not present

## 2016-07-17 DIAGNOSIS — M25562 Pain in left knee: Secondary | ICD-10-CM | POA: Diagnosis not present

## 2016-07-17 DIAGNOSIS — R293 Abnormal posture: Secondary | ICD-10-CM | POA: Diagnosis not present

## 2016-07-17 DIAGNOSIS — M25462 Effusion, left knee: Secondary | ICD-10-CM | POA: Diagnosis not present

## 2016-07-17 DIAGNOSIS — Z96652 Presence of left artificial knee joint: Secondary | ICD-10-CM | POA: Diagnosis not present

## 2016-07-17 DIAGNOSIS — M25662 Stiffness of left knee, not elsewhere classified: Secondary | ICD-10-CM | POA: Diagnosis not present

## 2016-07-20 DIAGNOSIS — M25462 Effusion, left knee: Secondary | ICD-10-CM | POA: Diagnosis not present

## 2016-07-20 DIAGNOSIS — R208 Other disturbances of skin sensation: Secondary | ICD-10-CM | POA: Diagnosis not present

## 2016-07-20 DIAGNOSIS — Z96652 Presence of left artificial knee joint: Secondary | ICD-10-CM | POA: Diagnosis not present

## 2016-07-20 DIAGNOSIS — M6281 Muscle weakness (generalized): Secondary | ICD-10-CM | POA: Diagnosis not present

## 2016-07-20 DIAGNOSIS — R2689 Other abnormalities of gait and mobility: Secondary | ICD-10-CM | POA: Diagnosis not present

## 2016-07-20 DIAGNOSIS — M25562 Pain in left knee: Secondary | ICD-10-CM | POA: Diagnosis not present

## 2016-07-24 DIAGNOSIS — M25462 Effusion, left knee: Secondary | ICD-10-CM | POA: Diagnosis not present

## 2016-07-24 DIAGNOSIS — M6281 Muscle weakness (generalized): Secondary | ICD-10-CM | POA: Diagnosis not present

## 2016-07-24 DIAGNOSIS — R208 Other disturbances of skin sensation: Secondary | ICD-10-CM | POA: Diagnosis not present

## 2016-07-24 DIAGNOSIS — Z96652 Presence of left artificial knee joint: Secondary | ICD-10-CM | POA: Diagnosis not present

## 2016-07-24 DIAGNOSIS — R2689 Other abnormalities of gait and mobility: Secondary | ICD-10-CM | POA: Diagnosis not present

## 2016-07-24 DIAGNOSIS — M25562 Pain in left knee: Secondary | ICD-10-CM | POA: Diagnosis not present

## 2016-07-26 DIAGNOSIS — M6281 Muscle weakness (generalized): Secondary | ICD-10-CM | POA: Diagnosis not present

## 2016-07-26 DIAGNOSIS — Z96652 Presence of left artificial knee joint: Secondary | ICD-10-CM | POA: Diagnosis not present

## 2016-07-26 DIAGNOSIS — R208 Other disturbances of skin sensation: Secondary | ICD-10-CM | POA: Diagnosis not present

## 2016-07-26 DIAGNOSIS — M25462 Effusion, left knee: Secondary | ICD-10-CM | POA: Diagnosis not present

## 2016-07-26 DIAGNOSIS — M25562 Pain in left knee: Secondary | ICD-10-CM | POA: Diagnosis not present

## 2016-07-26 DIAGNOSIS — R2689 Other abnormalities of gait and mobility: Secondary | ICD-10-CM | POA: Diagnosis not present

## 2016-07-27 DIAGNOSIS — M6281 Muscle weakness (generalized): Secondary | ICD-10-CM | POA: Diagnosis not present

## 2016-07-27 DIAGNOSIS — R208 Other disturbances of skin sensation: Secondary | ICD-10-CM | POA: Diagnosis not present

## 2016-07-27 DIAGNOSIS — Z96652 Presence of left artificial knee joint: Secondary | ICD-10-CM | POA: Diagnosis not present

## 2016-07-27 DIAGNOSIS — R2689 Other abnormalities of gait and mobility: Secondary | ICD-10-CM | POA: Diagnosis not present

## 2016-07-27 DIAGNOSIS — M25562 Pain in left knee: Secondary | ICD-10-CM | POA: Diagnosis not present

## 2016-07-27 DIAGNOSIS — M25462 Effusion, left knee: Secondary | ICD-10-CM | POA: Diagnosis not present

## 2016-07-30 DIAGNOSIS — M25562 Pain in left knee: Secondary | ICD-10-CM | POA: Diagnosis not present

## 2016-07-30 DIAGNOSIS — M25462 Effusion, left knee: Secondary | ICD-10-CM | POA: Diagnosis not present

## 2016-07-30 DIAGNOSIS — R2689 Other abnormalities of gait and mobility: Secondary | ICD-10-CM | POA: Diagnosis not present

## 2016-07-30 DIAGNOSIS — Z96652 Presence of left artificial knee joint: Secondary | ICD-10-CM | POA: Diagnosis not present

## 2016-07-30 DIAGNOSIS — R208 Other disturbances of skin sensation: Secondary | ICD-10-CM | POA: Diagnosis not present

## 2016-07-30 DIAGNOSIS — M6281 Muscle weakness (generalized): Secondary | ICD-10-CM | POA: Diagnosis not present

## 2016-08-07 DIAGNOSIS — M25462 Effusion, left knee: Secondary | ICD-10-CM | POA: Diagnosis not present

## 2016-08-07 DIAGNOSIS — R2689 Other abnormalities of gait and mobility: Secondary | ICD-10-CM | POA: Diagnosis not present

## 2016-08-07 DIAGNOSIS — R208 Other disturbances of skin sensation: Secondary | ICD-10-CM | POA: Diagnosis not present

## 2016-08-07 DIAGNOSIS — Z96652 Presence of left artificial knee joint: Secondary | ICD-10-CM | POA: Diagnosis not present

## 2016-08-07 DIAGNOSIS — M25562 Pain in left knee: Secondary | ICD-10-CM | POA: Diagnosis not present

## 2016-08-07 DIAGNOSIS — M6281 Muscle weakness (generalized): Secondary | ICD-10-CM | POA: Diagnosis not present

## 2016-08-08 DIAGNOSIS — M25562 Pain in left knee: Secondary | ICD-10-CM | POA: Diagnosis not present

## 2016-08-08 DIAGNOSIS — M25462 Effusion, left knee: Secondary | ICD-10-CM | POA: Diagnosis not present

## 2016-08-08 DIAGNOSIS — M6281 Muscle weakness (generalized): Secondary | ICD-10-CM | POA: Diagnosis not present

## 2016-08-08 DIAGNOSIS — R2689 Other abnormalities of gait and mobility: Secondary | ICD-10-CM | POA: Diagnosis not present

## 2016-08-08 DIAGNOSIS — Z96652 Presence of left artificial knee joint: Secondary | ICD-10-CM | POA: Diagnosis not present

## 2016-08-08 DIAGNOSIS — R208 Other disturbances of skin sensation: Secondary | ICD-10-CM | POA: Diagnosis not present

## 2016-08-10 DIAGNOSIS — Z96652 Presence of left artificial knee joint: Secondary | ICD-10-CM | POA: Diagnosis not present

## 2016-08-10 DIAGNOSIS — R2689 Other abnormalities of gait and mobility: Secondary | ICD-10-CM | POA: Diagnosis not present

## 2016-08-10 DIAGNOSIS — M6281 Muscle weakness (generalized): Secondary | ICD-10-CM | POA: Diagnosis not present

## 2016-08-10 DIAGNOSIS — R208 Other disturbances of skin sensation: Secondary | ICD-10-CM | POA: Diagnosis not present

## 2016-08-10 DIAGNOSIS — M25562 Pain in left knee: Secondary | ICD-10-CM | POA: Diagnosis not present

## 2016-08-10 DIAGNOSIS — M25462 Effusion, left knee: Secondary | ICD-10-CM | POA: Diagnosis not present

## 2016-08-13 DIAGNOSIS — M25562 Pain in left knee: Secondary | ICD-10-CM | POA: Diagnosis not present

## 2016-08-13 DIAGNOSIS — R208 Other disturbances of skin sensation: Secondary | ICD-10-CM | POA: Diagnosis not present

## 2016-08-13 DIAGNOSIS — Z96652 Presence of left artificial knee joint: Secondary | ICD-10-CM | POA: Diagnosis not present

## 2016-08-13 DIAGNOSIS — M25462 Effusion, left knee: Secondary | ICD-10-CM | POA: Diagnosis not present

## 2016-08-13 DIAGNOSIS — R2689 Other abnormalities of gait and mobility: Secondary | ICD-10-CM | POA: Diagnosis not present

## 2016-08-13 DIAGNOSIS — M6281 Muscle weakness (generalized): Secondary | ICD-10-CM | POA: Diagnosis not present

## 2016-08-15 DIAGNOSIS — M25462 Effusion, left knee: Secondary | ICD-10-CM | POA: Diagnosis not present

## 2016-08-15 DIAGNOSIS — Z96652 Presence of left artificial knee joint: Secondary | ICD-10-CM | POA: Diagnosis not present

## 2016-08-15 DIAGNOSIS — M6281 Muscle weakness (generalized): Secondary | ICD-10-CM | POA: Diagnosis not present

## 2016-08-15 DIAGNOSIS — R208 Other disturbances of skin sensation: Secondary | ICD-10-CM | POA: Diagnosis not present

## 2016-08-15 DIAGNOSIS — R2689 Other abnormalities of gait and mobility: Secondary | ICD-10-CM | POA: Diagnosis not present

## 2016-08-15 DIAGNOSIS — M25562 Pain in left knee: Secondary | ICD-10-CM | POA: Diagnosis not present

## 2016-08-20 DIAGNOSIS — M6281 Muscle weakness (generalized): Secondary | ICD-10-CM | POA: Diagnosis not present

## 2016-08-20 DIAGNOSIS — R208 Other disturbances of skin sensation: Secondary | ICD-10-CM | POA: Diagnosis not present

## 2016-08-20 DIAGNOSIS — M25462 Effusion, left knee: Secondary | ICD-10-CM | POA: Diagnosis not present

## 2016-08-20 DIAGNOSIS — R293 Abnormal posture: Secondary | ICD-10-CM | POA: Diagnosis not present

## 2016-08-20 DIAGNOSIS — R2681 Unsteadiness on feet: Secondary | ICD-10-CM | POA: Diagnosis not present

## 2016-08-20 DIAGNOSIS — R2689 Other abnormalities of gait and mobility: Secondary | ICD-10-CM | POA: Diagnosis not present

## 2016-08-20 DIAGNOSIS — M25662 Stiffness of left knee, not elsewhere classified: Secondary | ICD-10-CM | POA: Diagnosis not present

## 2016-08-20 DIAGNOSIS — Z96652 Presence of left artificial knee joint: Secondary | ICD-10-CM | POA: Diagnosis not present

## 2016-08-20 DIAGNOSIS — M25562 Pain in left knee: Secondary | ICD-10-CM | POA: Diagnosis not present

## 2016-08-23 DIAGNOSIS — M25562 Pain in left knee: Secondary | ICD-10-CM | POA: Diagnosis not present

## 2016-08-23 DIAGNOSIS — M6281 Muscle weakness (generalized): Secondary | ICD-10-CM | POA: Diagnosis not present

## 2016-08-23 DIAGNOSIS — R2689 Other abnormalities of gait and mobility: Secondary | ICD-10-CM | POA: Diagnosis not present

## 2016-08-23 DIAGNOSIS — R208 Other disturbances of skin sensation: Secondary | ICD-10-CM | POA: Diagnosis not present

## 2016-08-23 DIAGNOSIS — M25462 Effusion, left knee: Secondary | ICD-10-CM | POA: Diagnosis not present

## 2016-08-23 DIAGNOSIS — Z96652 Presence of left artificial knee joint: Secondary | ICD-10-CM | POA: Diagnosis not present

## 2016-08-24 DIAGNOSIS — Z96659 Presence of unspecified artificial knee joint: Secondary | ICD-10-CM | POA: Diagnosis not present

## 2016-08-24 DIAGNOSIS — M1711 Unilateral primary osteoarthritis, right knee: Secondary | ICD-10-CM | POA: Diagnosis not present

## 2016-08-27 DIAGNOSIS — M25462 Effusion, left knee: Secondary | ICD-10-CM | POA: Diagnosis not present

## 2016-08-27 DIAGNOSIS — M6281 Muscle weakness (generalized): Secondary | ICD-10-CM | POA: Diagnosis not present

## 2016-08-27 DIAGNOSIS — M25562 Pain in left knee: Secondary | ICD-10-CM | POA: Diagnosis not present

## 2016-08-27 DIAGNOSIS — R208 Other disturbances of skin sensation: Secondary | ICD-10-CM | POA: Diagnosis not present

## 2016-08-27 DIAGNOSIS — R2689 Other abnormalities of gait and mobility: Secondary | ICD-10-CM | POA: Diagnosis not present

## 2016-08-27 DIAGNOSIS — Z96652 Presence of left artificial knee joint: Secondary | ICD-10-CM | POA: Diagnosis not present

## 2016-08-28 DIAGNOSIS — M5412 Radiculopathy, cervical region: Secondary | ICD-10-CM

## 2016-08-28 DIAGNOSIS — G629 Polyneuropathy, unspecified: Secondary | ICD-10-CM | POA: Diagnosis not present

## 2016-08-28 DIAGNOSIS — G609 Hereditary and idiopathic neuropathy, unspecified: Secondary | ICD-10-CM

## 2016-08-28 DIAGNOSIS — G9589 Other specified diseases of spinal cord: Secondary | ICD-10-CM

## 2016-08-28 DIAGNOSIS — G959 Disease of spinal cord, unspecified: Secondary | ICD-10-CM | POA: Diagnosis not present

## 2016-08-28 DIAGNOSIS — R0989 Other specified symptoms and signs involving the circulatory and respiratory systems: Secondary | ICD-10-CM

## 2016-08-28 HISTORY — DX: Other specified diseases of spinal cord: G95.89

## 2016-08-28 HISTORY — DX: Radiculopathy, cervical region: M54.12

## 2016-08-28 HISTORY — DX: Other specified symptoms and signs involving the circulatory and respiratory systems: R09.89

## 2016-08-28 HISTORY — DX: Hereditary and idiopathic neuropathy, unspecified: G60.9

## 2016-08-29 DIAGNOSIS — I6523 Occlusion and stenosis of bilateral carotid arteries: Secondary | ICD-10-CM | POA: Diagnosis not present

## 2016-08-29 DIAGNOSIS — R0989 Other specified symptoms and signs involving the circulatory and respiratory systems: Secondary | ICD-10-CM | POA: Diagnosis not present

## 2016-08-31 DIAGNOSIS — M25562 Pain in left knee: Secondary | ICD-10-CM | POA: Diagnosis not present

## 2016-08-31 DIAGNOSIS — R2689 Other abnormalities of gait and mobility: Secondary | ICD-10-CM | POA: Diagnosis not present

## 2016-08-31 DIAGNOSIS — Z96652 Presence of left artificial knee joint: Secondary | ICD-10-CM | POA: Diagnosis not present

## 2016-08-31 DIAGNOSIS — M6281 Muscle weakness (generalized): Secondary | ICD-10-CM | POA: Diagnosis not present

## 2016-08-31 DIAGNOSIS — M25462 Effusion, left knee: Secondary | ICD-10-CM | POA: Diagnosis not present

## 2016-08-31 DIAGNOSIS — R208 Other disturbances of skin sensation: Secondary | ICD-10-CM | POA: Diagnosis not present

## 2016-09-03 DIAGNOSIS — Z96652 Presence of left artificial knee joint: Secondary | ICD-10-CM | POA: Diagnosis not present

## 2016-09-03 DIAGNOSIS — M25562 Pain in left knee: Secondary | ICD-10-CM | POA: Diagnosis not present

## 2016-09-03 DIAGNOSIS — R2689 Other abnormalities of gait and mobility: Secondary | ICD-10-CM | POA: Diagnosis not present

## 2016-09-03 DIAGNOSIS — R208 Other disturbances of skin sensation: Secondary | ICD-10-CM | POA: Diagnosis not present

## 2016-09-03 DIAGNOSIS — M6281 Muscle weakness (generalized): Secondary | ICD-10-CM | POA: Diagnosis not present

## 2016-09-03 DIAGNOSIS — M25462 Effusion, left knee: Secondary | ICD-10-CM | POA: Diagnosis not present

## 2016-09-06 DIAGNOSIS — R2689 Other abnormalities of gait and mobility: Secondary | ICD-10-CM | POA: Diagnosis not present

## 2016-09-06 DIAGNOSIS — M25562 Pain in left knee: Secondary | ICD-10-CM | POA: Diagnosis not present

## 2016-09-06 DIAGNOSIS — M25462 Effusion, left knee: Secondary | ICD-10-CM | POA: Diagnosis not present

## 2016-09-06 DIAGNOSIS — M6281 Muscle weakness (generalized): Secondary | ICD-10-CM | POA: Diagnosis not present

## 2016-09-06 DIAGNOSIS — R208 Other disturbances of skin sensation: Secondary | ICD-10-CM | POA: Diagnosis not present

## 2016-09-06 DIAGNOSIS — Z96652 Presence of left artificial knee joint: Secondary | ICD-10-CM | POA: Diagnosis not present

## 2016-09-10 DIAGNOSIS — M25562 Pain in left knee: Secondary | ICD-10-CM | POA: Diagnosis not present

## 2016-09-10 DIAGNOSIS — R2689 Other abnormalities of gait and mobility: Secondary | ICD-10-CM | POA: Diagnosis not present

## 2016-09-10 DIAGNOSIS — Z96652 Presence of left artificial knee joint: Secondary | ICD-10-CM | POA: Diagnosis not present

## 2016-09-10 DIAGNOSIS — R208 Other disturbances of skin sensation: Secondary | ICD-10-CM | POA: Diagnosis not present

## 2016-09-10 DIAGNOSIS — M25462 Effusion, left knee: Secondary | ICD-10-CM | POA: Diagnosis not present

## 2016-09-10 DIAGNOSIS — M6281 Muscle weakness (generalized): Secondary | ICD-10-CM | POA: Diagnosis not present

## 2016-09-11 DIAGNOSIS — G629 Polyneuropathy, unspecified: Secondary | ICD-10-CM | POA: Diagnosis not present

## 2016-09-11 DIAGNOSIS — G959 Disease of spinal cord, unspecified: Secondary | ICD-10-CM | POA: Diagnosis not present

## 2016-09-11 DIAGNOSIS — R0989 Other specified symptoms and signs involving the circulatory and respiratory systems: Secondary | ICD-10-CM | POA: Diagnosis not present

## 2016-09-11 DIAGNOSIS — E041 Nontoxic single thyroid nodule: Secondary | ICD-10-CM

## 2016-09-11 HISTORY — DX: Nontoxic single thyroid nodule: E04.1

## 2016-09-13 DIAGNOSIS — R2681 Unsteadiness on feet: Secondary | ICD-10-CM | POA: Diagnosis not present

## 2016-09-13 DIAGNOSIS — M25462 Effusion, left knee: Secondary | ICD-10-CM | POA: Diagnosis not present

## 2016-09-13 DIAGNOSIS — R2689 Other abnormalities of gait and mobility: Secondary | ICD-10-CM | POA: Diagnosis not present

## 2016-09-13 DIAGNOSIS — Z96652 Presence of left artificial knee joint: Secondary | ICD-10-CM | POA: Diagnosis not present

## 2016-09-13 DIAGNOSIS — M6281 Muscle weakness (generalized): Secondary | ICD-10-CM | POA: Diagnosis not present

## 2016-09-13 DIAGNOSIS — R293 Abnormal posture: Secondary | ICD-10-CM | POA: Diagnosis not present

## 2016-09-13 DIAGNOSIS — R208 Other disturbances of skin sensation: Secondary | ICD-10-CM | POA: Diagnosis not present

## 2016-09-13 DIAGNOSIS — M25662 Stiffness of left knee, not elsewhere classified: Secondary | ICD-10-CM | POA: Diagnosis not present

## 2016-09-13 DIAGNOSIS — M25562 Pain in left knee: Secondary | ICD-10-CM | POA: Diagnosis not present

## 2016-09-17 DIAGNOSIS — R202 Paresthesia of skin: Secondary | ICD-10-CM | POA: Diagnosis not present

## 2016-09-17 DIAGNOSIS — Z808 Family history of malignant neoplasm of other organs or systems: Secondary | ICD-10-CM | POA: Diagnosis not present

## 2016-09-17 DIAGNOSIS — R2689 Other abnormalities of gait and mobility: Secondary | ICD-10-CM | POA: Diagnosis not present

## 2016-09-17 DIAGNOSIS — G609 Hereditary and idiopathic neuropathy, unspecified: Secondary | ICD-10-CM | POA: Diagnosis not present

## 2016-09-17 DIAGNOSIS — M6281 Muscle weakness (generalized): Secondary | ICD-10-CM | POA: Diagnosis not present

## 2016-09-17 DIAGNOSIS — M25462 Effusion, left knee: Secondary | ICD-10-CM | POA: Diagnosis not present

## 2016-09-17 DIAGNOSIS — E041 Nontoxic single thyroid nodule: Secondary | ICD-10-CM | POA: Diagnosis not present

## 2016-09-17 DIAGNOSIS — Z79899 Other long term (current) drug therapy: Secondary | ICD-10-CM | POA: Diagnosis not present

## 2016-09-17 DIAGNOSIS — Z96652 Presence of left artificial knee joint: Secondary | ICD-10-CM | POA: Diagnosis not present

## 2016-09-17 DIAGNOSIS — M25562 Pain in left knee: Secondary | ICD-10-CM | POA: Diagnosis not present

## 2016-09-17 DIAGNOSIS — R208 Other disturbances of skin sensation: Secondary | ICD-10-CM | POA: Diagnosis not present

## 2016-09-20 DIAGNOSIS — Z96652 Presence of left artificial knee joint: Secondary | ICD-10-CM | POA: Diagnosis not present

## 2016-09-20 DIAGNOSIS — R208 Other disturbances of skin sensation: Secondary | ICD-10-CM | POA: Diagnosis not present

## 2016-09-20 DIAGNOSIS — M6281 Muscle weakness (generalized): Secondary | ICD-10-CM | POA: Diagnosis not present

## 2016-09-20 DIAGNOSIS — M25562 Pain in left knee: Secondary | ICD-10-CM | POA: Diagnosis not present

## 2016-09-20 DIAGNOSIS — R2689 Other abnormalities of gait and mobility: Secondary | ICD-10-CM | POA: Diagnosis not present

## 2016-09-20 DIAGNOSIS — M25462 Effusion, left knee: Secondary | ICD-10-CM | POA: Diagnosis not present

## 2016-09-25 DIAGNOSIS — R221 Localized swelling, mass and lump, neck: Secondary | ICD-10-CM | POA: Diagnosis not present

## 2016-09-25 DIAGNOSIS — E041 Nontoxic single thyroid nodule: Secondary | ICD-10-CM | POA: Diagnosis not present

## 2016-09-27 DIAGNOSIS — R208 Other disturbances of skin sensation: Secondary | ICD-10-CM | POA: Diagnosis not present

## 2016-09-27 DIAGNOSIS — M25562 Pain in left knee: Secondary | ICD-10-CM | POA: Diagnosis not present

## 2016-09-27 DIAGNOSIS — M25462 Effusion, left knee: Secondary | ICD-10-CM | POA: Diagnosis not present

## 2016-09-27 DIAGNOSIS — M6281 Muscle weakness (generalized): Secondary | ICD-10-CM | POA: Diagnosis not present

## 2016-09-27 DIAGNOSIS — Z96652 Presence of left artificial knee joint: Secondary | ICD-10-CM | POA: Diagnosis not present

## 2016-09-27 DIAGNOSIS — R2689 Other abnormalities of gait and mobility: Secondary | ICD-10-CM | POA: Diagnosis not present

## 2016-10-03 DIAGNOSIS — R208 Other disturbances of skin sensation: Secondary | ICD-10-CM | POA: Diagnosis not present

## 2016-10-03 DIAGNOSIS — R2689 Other abnormalities of gait and mobility: Secondary | ICD-10-CM | POA: Diagnosis not present

## 2016-10-03 DIAGNOSIS — M6281 Muscle weakness (generalized): Secondary | ICD-10-CM | POA: Diagnosis not present

## 2016-10-03 DIAGNOSIS — M25462 Effusion, left knee: Secondary | ICD-10-CM | POA: Diagnosis not present

## 2016-10-03 DIAGNOSIS — M25562 Pain in left knee: Secondary | ICD-10-CM | POA: Diagnosis not present

## 2016-10-03 DIAGNOSIS — Z96652 Presence of left artificial knee joint: Secondary | ICD-10-CM | POA: Diagnosis not present

## 2016-10-05 DIAGNOSIS — M25462 Effusion, left knee: Secondary | ICD-10-CM | POA: Diagnosis not present

## 2016-10-05 DIAGNOSIS — R2689 Other abnormalities of gait and mobility: Secondary | ICD-10-CM | POA: Diagnosis not present

## 2016-10-05 DIAGNOSIS — Z96652 Presence of left artificial knee joint: Secondary | ICD-10-CM | POA: Diagnosis not present

## 2016-10-05 DIAGNOSIS — M6281 Muscle weakness (generalized): Secondary | ICD-10-CM | POA: Diagnosis not present

## 2016-10-05 DIAGNOSIS — M25562 Pain in left knee: Secondary | ICD-10-CM | POA: Diagnosis not present

## 2016-10-05 DIAGNOSIS — R208 Other disturbances of skin sensation: Secondary | ICD-10-CM | POA: Diagnosis not present

## 2016-10-09 DIAGNOSIS — R208 Other disturbances of skin sensation: Secondary | ICD-10-CM | POA: Diagnosis not present

## 2016-10-09 DIAGNOSIS — M25562 Pain in left knee: Secondary | ICD-10-CM | POA: Diagnosis not present

## 2016-10-09 DIAGNOSIS — M25462 Effusion, left knee: Secondary | ICD-10-CM | POA: Diagnosis not present

## 2016-10-09 DIAGNOSIS — M6281 Muscle weakness (generalized): Secondary | ICD-10-CM | POA: Diagnosis not present

## 2016-10-09 DIAGNOSIS — R2689 Other abnormalities of gait and mobility: Secondary | ICD-10-CM | POA: Diagnosis not present

## 2016-10-09 DIAGNOSIS — Z96652 Presence of left artificial knee joint: Secondary | ICD-10-CM | POA: Diagnosis not present

## 2016-10-11 DIAGNOSIS — M25562 Pain in left knee: Secondary | ICD-10-CM | POA: Diagnosis not present

## 2016-10-11 DIAGNOSIS — Z96652 Presence of left artificial knee joint: Secondary | ICD-10-CM | POA: Diagnosis not present

## 2016-10-11 DIAGNOSIS — M25462 Effusion, left knee: Secondary | ICD-10-CM | POA: Diagnosis not present

## 2016-10-11 DIAGNOSIS — M6281 Muscle weakness (generalized): Secondary | ICD-10-CM | POA: Diagnosis not present

## 2016-10-11 DIAGNOSIS — R208 Other disturbances of skin sensation: Secondary | ICD-10-CM | POA: Diagnosis not present

## 2016-10-11 DIAGNOSIS — R2689 Other abnormalities of gait and mobility: Secondary | ICD-10-CM | POA: Diagnosis not present

## 2016-10-16 DIAGNOSIS — Z96652 Presence of left artificial knee joint: Secondary | ICD-10-CM | POA: Diagnosis not present

## 2016-10-18 DIAGNOSIS — Z96652 Presence of left artificial knee joint: Secondary | ICD-10-CM | POA: Diagnosis not present

## 2016-10-26 DIAGNOSIS — Z96652 Presence of left artificial knee joint: Secondary | ICD-10-CM | POA: Diagnosis not present

## 2016-10-30 DIAGNOSIS — Z96652 Presence of left artificial knee joint: Secondary | ICD-10-CM | POA: Diagnosis not present

## 2016-11-02 DIAGNOSIS — Z96652 Presence of left artificial knee joint: Secondary | ICD-10-CM | POA: Diagnosis not present

## 2016-11-05 DIAGNOSIS — Z96652 Presence of left artificial knee joint: Secondary | ICD-10-CM | POA: Diagnosis not present

## 2016-11-08 DIAGNOSIS — Z96652 Presence of left artificial knee joint: Secondary | ICD-10-CM | POA: Diagnosis not present

## 2016-11-13 DIAGNOSIS — M25662 Stiffness of left knee, not elsewhere classified: Secondary | ICD-10-CM | POA: Diagnosis not present

## 2016-11-13 DIAGNOSIS — Z96652 Presence of left artificial knee joint: Secondary | ICD-10-CM | POA: Diagnosis not present

## 2016-11-13 DIAGNOSIS — M25462 Effusion, left knee: Secondary | ICD-10-CM | POA: Diagnosis not present

## 2016-11-13 DIAGNOSIS — R208 Other disturbances of skin sensation: Secondary | ICD-10-CM | POA: Diagnosis not present

## 2016-11-13 DIAGNOSIS — M6281 Muscle weakness (generalized): Secondary | ICD-10-CM | POA: Diagnosis not present

## 2016-11-13 DIAGNOSIS — M25562 Pain in left knee: Secondary | ICD-10-CM | POA: Diagnosis not present

## 2016-11-13 DIAGNOSIS — R2681 Unsteadiness on feet: Secondary | ICD-10-CM | POA: Diagnosis not present

## 2016-11-13 DIAGNOSIS — R2689 Other abnormalities of gait and mobility: Secondary | ICD-10-CM | POA: Diagnosis not present

## 2016-11-13 DIAGNOSIS — R293 Abnormal posture: Secondary | ICD-10-CM | POA: Diagnosis not present

## 2016-11-15 DIAGNOSIS — M25462 Effusion, left knee: Secondary | ICD-10-CM | POA: Diagnosis not present

## 2016-11-15 DIAGNOSIS — R208 Other disturbances of skin sensation: Secondary | ICD-10-CM | POA: Diagnosis not present

## 2016-11-15 DIAGNOSIS — R2689 Other abnormalities of gait and mobility: Secondary | ICD-10-CM | POA: Diagnosis not present

## 2016-11-15 DIAGNOSIS — M6281 Muscle weakness (generalized): Secondary | ICD-10-CM | POA: Diagnosis not present

## 2016-11-15 DIAGNOSIS — M25562 Pain in left knee: Secondary | ICD-10-CM | POA: Diagnosis not present

## 2016-11-15 DIAGNOSIS — Z96652 Presence of left artificial knee joint: Secondary | ICD-10-CM | POA: Diagnosis not present

## 2016-11-20 DIAGNOSIS — R2689 Other abnormalities of gait and mobility: Secondary | ICD-10-CM | POA: Diagnosis not present

## 2016-11-20 DIAGNOSIS — M6281 Muscle weakness (generalized): Secondary | ICD-10-CM | POA: Diagnosis not present

## 2016-11-20 DIAGNOSIS — Z96652 Presence of left artificial knee joint: Secondary | ICD-10-CM | POA: Diagnosis not present

## 2016-11-20 DIAGNOSIS — R208 Other disturbances of skin sensation: Secondary | ICD-10-CM | POA: Diagnosis not present

## 2016-11-20 DIAGNOSIS — M25562 Pain in left knee: Secondary | ICD-10-CM | POA: Diagnosis not present

## 2016-11-20 DIAGNOSIS — M25462 Effusion, left knee: Secondary | ICD-10-CM | POA: Diagnosis not present

## 2016-11-22 DIAGNOSIS — Z96652 Presence of left artificial knee joint: Secondary | ICD-10-CM | POA: Diagnosis not present

## 2016-11-22 DIAGNOSIS — R208 Other disturbances of skin sensation: Secondary | ICD-10-CM | POA: Diagnosis not present

## 2016-11-22 DIAGNOSIS — R2689 Other abnormalities of gait and mobility: Secondary | ICD-10-CM | POA: Diagnosis not present

## 2016-11-22 DIAGNOSIS — M6281 Muscle weakness (generalized): Secondary | ICD-10-CM | POA: Diagnosis not present

## 2016-11-22 DIAGNOSIS — R233 Spontaneous ecchymoses: Secondary | ICD-10-CM | POA: Diagnosis not present

## 2016-11-22 DIAGNOSIS — M25462 Effusion, left knee: Secondary | ICD-10-CM | POA: Diagnosis not present

## 2016-11-22 DIAGNOSIS — M25562 Pain in left knee: Secondary | ICD-10-CM | POA: Diagnosis not present

## 2016-11-22 DIAGNOSIS — L821 Other seborrheic keratosis: Secondary | ICD-10-CM | POA: Diagnosis not present

## 2016-11-23 DIAGNOSIS — Z96659 Presence of unspecified artificial knee joint: Secondary | ICD-10-CM | POA: Diagnosis not present

## 2016-11-23 DIAGNOSIS — M1712 Unilateral primary osteoarthritis, left knee: Secondary | ICD-10-CM | POA: Diagnosis not present

## 2016-11-27 DIAGNOSIS — M25562 Pain in left knee: Secondary | ICD-10-CM | POA: Diagnosis not present

## 2016-11-27 DIAGNOSIS — Z96652 Presence of left artificial knee joint: Secondary | ICD-10-CM | POA: Diagnosis not present

## 2016-11-27 DIAGNOSIS — R2689 Other abnormalities of gait and mobility: Secondary | ICD-10-CM | POA: Diagnosis not present

## 2016-11-27 DIAGNOSIS — M25462 Effusion, left knee: Secondary | ICD-10-CM | POA: Diagnosis not present

## 2016-11-27 DIAGNOSIS — R208 Other disturbances of skin sensation: Secondary | ICD-10-CM | POA: Diagnosis not present

## 2016-11-27 DIAGNOSIS — M6281 Muscle weakness (generalized): Secondary | ICD-10-CM | POA: Diagnosis not present

## 2016-12-24 DIAGNOSIS — Z1231 Encounter for screening mammogram for malignant neoplasm of breast: Secondary | ICD-10-CM | POA: Diagnosis not present

## 2017-01-11 DIAGNOSIS — M1712 Unilateral primary osteoarthritis, left knee: Secondary | ICD-10-CM | POA: Diagnosis not present

## 2017-01-11 DIAGNOSIS — Z96659 Presence of unspecified artificial knee joint: Secondary | ICD-10-CM | POA: Diagnosis not present

## 2017-02-21 DIAGNOSIS — G609 Hereditary and idiopathic neuropathy, unspecified: Secondary | ICD-10-CM | POA: Diagnosis not present

## 2017-02-21 DIAGNOSIS — R202 Paresthesia of skin: Secondary | ICD-10-CM | POA: Diagnosis not present

## 2017-02-21 DIAGNOSIS — G5791 Unspecified mononeuropathy of right lower limb: Secondary | ICD-10-CM | POA: Diagnosis not present

## 2017-02-21 DIAGNOSIS — Z96652 Presence of left artificial knee joint: Secondary | ICD-10-CM | POA: Diagnosis not present

## 2017-02-22 DIAGNOSIS — E538 Deficiency of other specified B group vitamins: Secondary | ICD-10-CM | POA: Diagnosis not present

## 2017-03-01 DIAGNOSIS — D510 Vitamin B12 deficiency anemia due to intrinsic factor deficiency: Secondary | ICD-10-CM | POA: Diagnosis not present

## 2017-03-08 DIAGNOSIS — D510 Vitamin B12 deficiency anemia due to intrinsic factor deficiency: Secondary | ICD-10-CM | POA: Diagnosis not present

## 2017-03-12 DIAGNOSIS — M25371 Other instability, right ankle: Secondary | ICD-10-CM

## 2017-03-12 DIAGNOSIS — M2170 Unequal limb length (acquired), unspecified site: Secondary | ICD-10-CM

## 2017-03-12 HISTORY — DX: Other instability, right ankle: M25.371

## 2017-03-12 HISTORY — DX: Unequal limb length (acquired), unspecified site: M21.70

## 2017-03-14 DIAGNOSIS — D510 Vitamin B12 deficiency anemia due to intrinsic factor deficiency: Secondary | ICD-10-CM | POA: Diagnosis not present

## 2017-04-12 DIAGNOSIS — I10 Essential (primary) hypertension: Secondary | ICD-10-CM | POA: Diagnosis not present

## 2017-04-12 DIAGNOSIS — G609 Hereditary and idiopathic neuropathy, unspecified: Secondary | ICD-10-CM | POA: Diagnosis not present

## 2017-04-12 DIAGNOSIS — Z6833 Body mass index (BMI) 33.0-33.9, adult: Secondary | ICD-10-CM | POA: Diagnosis not present

## 2017-04-12 DIAGNOSIS — D510 Vitamin B12 deficiency anemia due to intrinsic factor deficiency: Secondary | ICD-10-CM | POA: Diagnosis not present

## 2017-04-12 DIAGNOSIS — Z23 Encounter for immunization: Secondary | ICD-10-CM | POA: Diagnosis not present

## 2017-05-10 DIAGNOSIS — D510 Vitamin B12 deficiency anemia due to intrinsic factor deficiency: Secondary | ICD-10-CM | POA: Diagnosis not present

## 2017-05-27 ENCOUNTER — Encounter: Payer: Self-pay | Admitting: Neurology

## 2017-05-27 DIAGNOSIS — M1712 Unilateral primary osteoarthritis, left knee: Secondary | ICD-10-CM | POA: Diagnosis not present

## 2017-05-31 DIAGNOSIS — M1711 Unilateral primary osteoarthritis, right knee: Secondary | ICD-10-CM | POA: Diagnosis not present

## 2017-06-13 DIAGNOSIS — E538 Deficiency of other specified B group vitamins: Secondary | ICD-10-CM | POA: Diagnosis not present

## 2017-06-27 DIAGNOSIS — M1711 Unilateral primary osteoarthritis, right knee: Secondary | ICD-10-CM | POA: Diagnosis not present

## 2017-07-10 DIAGNOSIS — D510 Vitamin B12 deficiency anemia due to intrinsic factor deficiency: Secondary | ICD-10-CM | POA: Diagnosis not present

## 2017-08-14 DIAGNOSIS — D510 Vitamin B12 deficiency anemia due to intrinsic factor deficiency: Secondary | ICD-10-CM | POA: Diagnosis not present

## 2017-08-29 NOTE — Progress Notes (Addendum)
[facility] Clinic Note - Initial Visit   Date: 08/30/17  Debra MoralesBetty R Harrison MRN: [id number] DOB: Aug 16, 1940   Dear Franchot MimesNadine Cato, PA-C:  Thank you for your kind referral of Debra Harrison for consultation of bilateral leg pain. Although her history is well known to you, please allow us to reiterate it for the purpose of our medical record. The patient was accompanied to the clinic by self.   History of Present Illness: Debra Harrison is a 77 y.o. right-handed Caucasian female with hypertension, hyperlipidemia, and acid reflux presenting for evaluation of bilateral leg pain and paresthesias.  On May 30 2016, she had left knee surgery and was getting epidural block and immediately developed sharp pain down her legs and knees.  She has constant pain in the legs since this time.  Pain is worse below the knees and especially in the feet.  It is described as burning, tingling, and numbness. She denies any weakness of the legs.  She has imbalance but is able to walk unassisted. MRI lumbar spine shows small 2-543mm hyperintensity in the posterior aspect of the conus-probably benign.  She was evaluated by Dr. Johnell ComingsMieden at [facility] for her leg pain.  She has previously been treated with gabapentin 600mg  TID and amitriptyline 50mg  at bedtime for bilateral feet paresthesias, which did not provide any relief so stopped this.  She takes tylenol 2-3 times per day.  Out-side paper records, electronic medical record, and images have been reviewed where available and summarized as:   MRI lumbar spine without contrast 06/25/2016: Small, 2-3 mm focus of hyperintense T2 to weighted signal within the posterior aspect of the conus medullaris, without associated contrast enhancement. This appearance is nonspecific, but the characteristics are most suggestive of a benign etiology. 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  Mild  lumbar degenerative changes, without significant spinal stenosis.  Past Medical History:  Diagnosis Date  . Acid reflux   . Hypercholesterolemia   . Hypertension   . Osteoarthritis   . Total knee replacement status, left     Past Surgical History:  Procedure Laterality Date  . ABDOMINAL HYSTERECTOMY    . left total knee arthroscopy       Medications:  Outpatient Encounter Medications as of 08/30/2017  Medication Sig  . B Complex Vitamins (B COMPLEX 50 PO) Take by mouth.  . cyclobenzaprine (FLEXERIL) 10 MG tablet Take 10 mg by mouth 3 (three) times daily as needed for muscle spasms.  . furosemide (LASIX) 40 MG tablet   . losartan (COZAAR) 50 MG tablet Take 50 mg by mouth daily.   No facility-administered encounter medications on file as of 08/30/2017.      Allergies:  Allergies  Allergen Reactions  . Methocarbamol Hives    ask   . Tetracyclines & Related Hives    Family History: Family History  Problem Relation Age of Onset  . Congestive Heart Failure Mother   . Colon cancer Mother   . Diabetes Mother   . Hypertension Mother   . Heart failure Father   . Diabetes Father   . Hypertension Father     Social History: Social History   Tobacco Use  . Smoking status: Former Smoker    Last attempt to quit: 1987    Years since quitting: 32.1  . Smokeless tobacco: Never Used  Substance Use Topics  . Alcohol use: Yes  . Drug use: No   Social History   Social  History Narrative  . Not on file    Review of Systems:  CONSTITUTIONAL: No fevers, chills, night sweats, or weight loss.   EYES: No visual changes or eye pain ENT: No hearing changes.  No history of nose bleeds.   RESPIRATORY: No cough, wheezing and shortness of breath.   CARDIOVASCULAR: Negative for chest pain, and palpitations.   GI: Negative for abdominal discomfort, blood in stools or black stools.  No recent change in bowel habits.   GU:  No history of incontinence.   MUSCLOSKELETAL: +history of  joint pain or swelling.  No myalgias.   SKIN: Negative for lesions, rash, and itching.   HEMATOLOGY/ONCOLOGY: Negative for prolonged bleeding, bruising easily, and swollen nodes.  No history of cancer.   ENDOCRINE: Negative for cold or heat intolerance, polydipsia or goiter.   PSYCH:  No depression or anxiety symptoms.   NEURO: As Above.   Vital Signs:  BP (!) 150/90   Pulse 86   Ht 5\' 8"  (1.727 m)   Wt 192 lb 4 oz (87.2 kg)   SpO2 95%   BMI 29.23 kg/m    General Medical Exam:   General:  Well appearing, comfortable.   Eyes/ENT: see cranial nerve examination.   Neck: No masses appreciated.  Full range of motion without tenderness.  No carotid bruits. Respiratory:  Clear to auscultation, good air entry bilaterally.   Cardiac:  Regular rate and rhythm with skipped beats, no murmur.   Extremities:  No deformities, edema, or skin discoloration.  Skin:  No rashes or lesions.  Neurological Exam: MENTAL STATUS including orientation to time, place, person, recent and remote memory, attention span and concentration, language, and fund of knowledge is normal.  Speech is not dysarthric.  CRANIAL NERVES: II:  No visual field defects.  Unremarkable fundi.   III-IV-VI: Pupils equal round and reactive to light.  Normal conjugate, extra-ocular eye movements in all directions of gaze.  No nystagmus.  No ptosis.   V:  Normal facial sensation.    VII:  Normal facial symmetry and movements.    VIII:  Normal hearing and vestibular function.   IX-X:  Normal palatal movement.   XI:  Normal shoulder shrug and head rotation.   XII:  Normal tongue strength and range of motion, no deviation or fasciculation.  MOTOR:  No atrophy, fasciculations or abnormal movements.  No pronator drift.  Tone is normal.    Right Upper Extremity:    Left Upper Extremity:    Deltoid  5/5   Deltoid  5/5   Biceps  5/5   Biceps  5/5   Triceps  5/5   Triceps  5/5   Wrist extensors  5/5   Wrist extensors  5/5   Wrist  flexors  5/5   Wrist flexors  5/5   Finger extensors  5/5   Finger extensors  5/5   Finger flexors  5/5   Finger flexors  5/5   Dorsal interossei  5/5   Dorsal interossei  5/5   Abductor pollicis  5/5   Abductor pollicis  5/5   Tone (Ashworth scale)  0  Tone (Ashworth scale)  0   Right Lower Extremity:    Left Lower Extremity:    Hip flexors  5/5   Hip flexors  5/5   Hip extensors  5/5   Hip extensors  5/5   Knee flexors  5/5   Knee flexors  5/5   Knee extensors  5/5   Knee extensors  5/5   Dorsiflexors  5/5   Dorsiflexors  5/5   Plantarflexors  5/5   Plantarflexors  5/5   Toe extensors  5/5   Toe extensors  5/5   Toe flexors  5/5   Toe flexors  5/5   Tone (Ashworth scale)  0  Tone (Ashworth scale)  0   MSRs:  Right                                                                 Left brachioradialis 2+  brachioradialis 2+  biceps 2+  biceps 2+  triceps 2+  triceps 2+  patellar 2+  patellar 2+  ankle jerk 2+  ankle jerk 2+  Hoffman no  Hoffman no  plantar response down  plantar response down   SENSORY:  Reduced pin prick and hyperesthesia to temperature and light touch distal to knees bilaterally.  Vibration is intact. Romberg's sign absent.   COORDINATION/GAIT: Normal finger-to- nose-finger.  Intact rapid alternating movements bilaterally.  Gait slightly-wide based, stable and unassisted.  She is able to perform stressed gait, unsteady with tandem gait.   IMPRESSION: Bilateral leg pain and paresthesias following epidural nerve block for knee surgery in November 2017. She does not have any weakness or abnormal reflexes, making neuropathy less likely, however will proceed with NCS/EMG of the legs to be sure.  Her MRI lumbar spine from 06/2016 shows small 2-3 mm focus of hyperintense T2 to weighted signal within the posterior aspect of the conus medullaris, which could explain distal leg pain so will repeat MRI to assess for any interval changes.   She has previously tried Lyrica  150mg /d (side effects), gabapentin 2400mg /d (ineffective), amitriptyline 50mg  (ineffective) for pain.  I will offer her a trial of Cymbalta 30mg /d and titrate as needed.    Auscultation of the heart and palpation of the radial pulse showed skipped beats ?PVCs vs other cardiac arrythmia.  Recommend that she get EKG and follow-up with her PCP for this.   Return to clinic in 4 months.  Thank you for allowing me to participate in patient's care.  If I can answer any additional questions, I would be pleased to do so.    Sincerely,    Donika K. Allena Katz, DO

## 2017-08-30 ENCOUNTER — Ambulatory Visit (INDEPENDENT_AMBULATORY_CARE_PROVIDER_SITE_OTHER): Payer: Medicare Other | Admitting: Neurology

## 2017-08-30 ENCOUNTER — Encounter: Payer: Self-pay | Admitting: Neurology

## 2017-08-30 VITALS — BP 150/90 | HR 86 | Ht 68.0 in | Wt 192.2 lb

## 2017-08-30 DIAGNOSIS — G959 Disease of spinal cord, unspecified: Secondary | ICD-10-CM

## 2017-08-30 DIAGNOSIS — G9589 Other specified diseases of spinal cord: Secondary | ICD-10-CM

## 2017-08-30 DIAGNOSIS — M792 Neuralgia and neuritis, unspecified: Secondary | ICD-10-CM

## 2017-08-30 MED ORDER — DULOXETINE HCL 30 MG PO CPEP: 30.0000 mg | ORAL_CAPSULE | Freq: Every day | ORAL | 5 refills | Status: DC

## 2017-08-30 NOTE — Patient Instructions (Addendum)
MRI lumbar wwo contrast  NCS/EMG of the legs.  Please do not apply lotion to your legs  Start Cymbalta 30mg  daily  Return to clinic in 4 months

## 2017-09-04 ENCOUNTER — Inpatient Hospital Stay: Admission: RE | Admit: 2017-09-04 | Payer: TRICARE For Life (TFL) | Source: Ambulatory Visit

## 2017-09-05 ENCOUNTER — Telehealth: Payer: Self-pay | Admitting: *Deleted

## 2017-09-05 ENCOUNTER — Other Ambulatory Visit: Payer: Self-pay | Admitting: *Deleted

## 2017-09-05 DIAGNOSIS — R002 Palpitations: Secondary | ICD-10-CM

## 2017-09-05 NOTE — Telephone Encounter (Signed)
Left message informing patient that she can have her EKG done at her PCP office between the hours of 8:30 and 11:30 and 2:00 and 4:30.

## 2017-09-06 DIAGNOSIS — I441 Atrioventricular block, second degree: Secondary | ICD-10-CM | POA: Diagnosis not present

## 2017-09-06 DIAGNOSIS — I499 Cardiac arrhythmia, unspecified: Secondary | ICD-10-CM | POA: Diagnosis not present

## 2017-09-09 DIAGNOSIS — K219 Gastro-esophageal reflux disease without esophagitis: Secondary | ICD-10-CM

## 2017-09-09 DIAGNOSIS — M25473 Effusion, unspecified ankle: Secondary | ICD-10-CM | POA: Insufficient documentation

## 2017-09-09 DIAGNOSIS — H8110 Benign paroxysmal vertigo, unspecified ear: Secondary | ICD-10-CM

## 2017-09-09 DIAGNOSIS — D510 Vitamin B12 deficiency anemia due to intrinsic factor deficiency: Secondary | ICD-10-CM

## 2017-09-09 DIAGNOSIS — M1710 Unilateral primary osteoarthritis, unspecified knee: Secondary | ICD-10-CM

## 2017-09-09 DIAGNOSIS — E785 Hyperlipidemia, unspecified: Secondary | ICD-10-CM

## 2017-09-09 DIAGNOSIS — R1310 Dysphagia, unspecified: Secondary | ICD-10-CM

## 2017-09-09 DIAGNOSIS — I441 Atrioventricular block, second degree: Secondary | ICD-10-CM

## 2017-09-09 DIAGNOSIS — N183 Chronic kidney disease, stage 3 unspecified: Secondary | ICD-10-CM

## 2017-09-09 DIAGNOSIS — R739 Hyperglycemia, unspecified: Secondary | ICD-10-CM

## 2017-09-09 DIAGNOSIS — I499 Cardiac arrhythmia, unspecified: Secondary | ICD-10-CM

## 2017-09-09 DIAGNOSIS — E538 Deficiency of other specified B group vitamins: Secondary | ICD-10-CM

## 2017-09-09 DIAGNOSIS — R079 Chest pain, unspecified: Secondary | ICD-10-CM

## 2017-09-09 DIAGNOSIS — E669 Obesity, unspecified: Secondary | ICD-10-CM

## 2017-09-09 DIAGNOSIS — I10 Essential (primary) hypertension: Secondary | ICD-10-CM

## 2017-09-09 HISTORY — DX: Unilateral primary osteoarthritis, unspecified knee: M17.10

## 2017-09-09 HISTORY — DX: Cardiac arrhythmia, unspecified: I49.9

## 2017-09-09 HISTORY — DX: Vitamin B12 deficiency anemia due to intrinsic factor deficiency: D51.0

## 2017-09-09 HISTORY — DX: Chest pain, unspecified: R07.9

## 2017-09-09 HISTORY — DX: Effusion, unspecified ankle: M25.473

## 2017-09-09 HISTORY — DX: Essential (primary) hypertension: I10

## 2017-09-09 HISTORY — DX: Hyperlipidemia, unspecified: E78.5

## 2017-09-09 HISTORY — DX: Atrioventricular block, second degree: I44.1

## 2017-09-09 HISTORY — DX: Benign paroxysmal vertigo, unspecified ear: H81.10

## 2017-09-09 HISTORY — DX: Gastro-esophageal reflux disease without esophagitis: K21.9

## 2017-09-09 HISTORY — DX: Chronic kidney disease, stage 3 unspecified: N18.30

## 2017-09-09 HISTORY — DX: Dysphagia, unspecified: R13.10

## 2017-09-09 HISTORY — DX: Hyperglycemia, unspecified: R73.9

## 2017-09-09 HISTORY — DX: Obesity, unspecified: E66.9

## 2017-09-09 HISTORY — DX: Deficiency of other specified B group vitamins: E53.8

## 2017-09-12 ENCOUNTER — Encounter: Payer: Self-pay | Admitting: Radiology

## 2017-09-12 ENCOUNTER — Ambulatory Visit
Admission: RE | Admit: 2017-09-12 | Discharge: 2017-09-12 | Disposition: A | Discharge status: Home / Self Care | Payer: Medicare Other | Source: Ambulatory Visit | Attending: Neurology | Admitting: Neurology

## 2017-09-12 DIAGNOSIS — M792 Neuralgia and neuritis, unspecified: Secondary | ICD-10-CM

## 2017-09-12 DIAGNOSIS — G9589 Other specified diseases of spinal cord: Secondary | ICD-10-CM

## 2017-09-12 DIAGNOSIS — M5126 Other intervertebral disc displacement, lumbar region: Secondary | ICD-10-CM | POA: Diagnosis not present

## 2017-09-12 DIAGNOSIS — D510 Vitamin B12 deficiency anemia due to intrinsic factor deficiency: Secondary | ICD-10-CM | POA: Diagnosis not present

## 2017-09-13 ENCOUNTER — Telehealth: Payer: Self-pay | Admitting: *Deleted

## 2017-09-13 NOTE — Telephone Encounter (Signed)
-----   Message from [place] Chardonika K Patel, DO sent at 09/13/2017  8:38 AM EST ----- Please inform patient that her MRI of the lumbar spine is stable, no new changes.  The small cyst is unchanged.  Thanks.

## 2017-09-13 NOTE — Telephone Encounter (Signed)
Patient given results

## 2017-09-17 ENCOUNTER — Ambulatory Visit (INDEPENDENT_AMBULATORY_CARE_PROVIDER_SITE_OTHER): Payer: Medicare Other | Admitting: Neurology

## 2017-09-17 DIAGNOSIS — M792 Neuralgia and neuritis, unspecified: Secondary | ICD-10-CM

## 2017-09-17 DIAGNOSIS — G959 Disease of spinal cord, unspecified: Secondary | ICD-10-CM | POA: Diagnosis not present

## 2017-09-17 DIAGNOSIS — G9589 Other specified diseases of spinal cord: Secondary | ICD-10-CM

## 2017-09-17 NOTE — Procedures (Signed)
[facility]  [address], [address]  [place], [place] [address] Tel: [phone] Fax:  [phone] Test Date:  09/17/2017  Patient: [place] NationBetty Harrison DOB: Nov 12, 1940 Physician: Nita Sickleonika Patel, DO  Sex: Female Height: 5\' 8"  Ref Phys: Nita Sickleonika Patel, DO  ID#: [id number] Temp: 32.6C Technician:    Patient Complaints: This is a 77 year old female referred for evaluation of bilateral leg pain and paresthesias.  NCV & EMG Findings: Extensive electrodiagnostic testing of the right lower extremity and additional studies of the left shows:  1. Bilateral sural and superficial peroneal sensory responses are within normal limits. 2. Bilateral peroneal and tibial motor responses are within normal limits. 3. Bilateral tibial H reflex studies are within normal limits. 4. There is no evidence of active or chronic motor axon loss changes affecting any of the tested muscles. Motor unit configuration and recruitment pattern is within normal limits.  Impression: This is a normal study of the lower extremities. In particular, there is no evidence of a large fiber sensorimotor polyneuropathy or lumbosacral radiculopathy.   ___________________________ Nita Sickleonika Patel, DO    Nerve Conduction Studies Anti Sensory Summary Table   Site NR Peak (ms) Norm Peak (ms) P-T Amp (V) Norm P-T Amp  Left Sup Peroneal Anti Sensory (Ant Lat Mall)  32.6C  12 cm    3.3 <4.6 4.4 >3  Right Sup Peroneal Anti Sensory (Ant Lat Mall)  32.6C  12 cm    2.5 <4.6 6.3 >3  Left Sural Anti Sensory (Lat Mall)  32.6C  Calf    4.2 <4.6 7.7 >3  Right Sural Anti Sensory (Lat Mall)  32.6C  Calf    3.2 <4.6 10.4 >3   Motor Summary Table   Site NR Onset (ms) Norm Onset (ms) O-P Amp (mV) Norm O-P Amp Site1 Site2 Delta-0 (ms) Dist (cm) Vel (m/s) Norm Vel (m/s)  Left Peroneal Motor (Ext Dig Brev)  32.6C  Ankle    5.3 <6.0 2.6 >2.5 B Fib Ankle 9.5 40.0 42 >40  B Fib    14.8  3.3  Poplt B Fib 0.7 8.0 114 >40  Poplt    15.5   3.3         Right Peroneal Motor (Ext Dig Brev)  32.6C  Ankle    5.3 <6.0 2.5 >2.5 B Fib Ankle 9.8 40.0 41 >40  B Fib    15.1  3.2  Poplt B Fib 0.5 8.0 160 >40  Poplt    15.6  3.3         Left Tibial Motor (Abd Hall Brev)  32.6C  Ankle    3.8 <6.0 4.3 >4 Knee Ankle 10.3 42.0 41 >40  Knee    14.1  3.0         Right Tibial Motor (Abd Hall Brev)  32.6C  Ankle    4.5 <6.0 4.5 >4 Knee Ankle 9.5 41.0 43 >40  Knee    14.0  3.3          H Reflex Studies   NR H-Lat (ms) Lat Norm (ms) L-R H-Lat (ms)  Left Tibial (Gastroc)  32.6C     33.74 <35 0.00  Right Tibial (Gastroc)  32.6C     33.74 <35 0.00   EMG   Side Muscle Ins Act Fibs Psw Fasc Number Recrt Dur Dur. Amp Amp. Poly Poly. Comment  Right AntTibialis Nml Nml Nml Nml Nml Nml Nml Nml Nml Nml Nml Nml N/A  Right Gastroc Nml Nml Nml Nml Nml Nml  Nml Nml Nml Nml Nml Nml N/A  Right Flex Dig Long Nml Nml Nml Nml Nml Nml Nml Nml Nml Nml Nml Nml N/A  Right RectFemoris Nml Nml Nml Nml Nml Nml Nml Nml Nml Nml Nml Nml N/A  Right GluteusMed Nml Nml Nml Nml Nml Nml Nml Nml Nml Nml Nml Nml N/A  Left AntTibialis Nml Nml Nml Nml Nml Nml Nml Nml Nml Nml Nml Nml N/A  Left Gastroc Nml Nml Nml Nml Nml Nml Nml Nml Nml Nml Nml Nml N/A  Left Flex Dig Long Nml Nml Nml Nml Nml Nml Nml Nml Nml Nml Nml Nml N/A  Left RectFemoris Nml Nml Nml Nml Nml Nml Nml Nml Nml Nml Nml Nml N/A  Left GluteusMed Nml Nml Nml Nml Nml Nml Nml Nml Nml Nml Nml Nml N/A      Waveforms:

## 2017-09-17 NOTE — Progress Notes (Signed)
    Follow-up Visit   Date: 09/17/17]   Debra MoralesBetty R Harrison MRN: [id number] DOB: 1941-02-07   Interim History: Debra Harrison is a 77 y.o. right-handed Caucasian female with hypertension, hyperlipidemia, and acid reflux returning to the clinic for electrodiagnostic testing of the legs and discuss results.  She has started Cymbalta 30 mg daily and has not noticed significant improvement yet. 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  Electrodiagnostic testing performed today does not show any evidence of peripheral neuropathy or lumbosacral pathology.   At this juncture, management will be symptomatic.  Fortunately, she is tolerating Cymbalta. I have asked her to call the office in a month, and if she continues to do well, I will increase this to Cymbalta 60 mg. All questions were answered.  Greater than 90% of this 15 minute visit was spent in counseling, explanation of diagnosis, planning of further management, and coordination of care.   Thank you for allowing me to participate in patient's care.  If I can answer any additional questions, I would be pleased to do so.    Sincerely,    Adonna Horsley K. Allena KatzPatel, DO

## 2017-09-18 DIAGNOSIS — I491 Atrial premature depolarization: Secondary | ICD-10-CM

## 2017-09-18 DIAGNOSIS — R9431 Abnormal electrocardiogram [ECG] [EKG]: Secondary | ICD-10-CM | POA: Insufficient documentation

## 2017-09-18 HISTORY — DX: Atrial premature depolarization: I49.1

## 2017-09-18 HISTORY — DX: Abnormal electrocardiogram (ECG) (EKG): R94.31

## 2017-09-18 NOTE — Progress Notes (Signed)
Cardiology Office Note:    Date:  09/19/2017   ID:  Debra Harrison, DOB [birth year], MRN [id number]  PCP:  Street, [name], MD  Cardiologist:  [name], MD   Referring MD: [address]reet, Stephanie Couphristopher Harrison, *  ASSESSMENT:    1. Abnormal electrocardiogram (ECG) (EKG)   2. APC (atrial premature contractions)   3. Benign essential hypertension    PLAN:    In order of problems listed above:  1. Her EKG shows atrial premature contractions she has little or no symptoms of cautioned to avoid proarrhythmic drugs such as amitriptyline and over-the-counter.  At this time I would not do an extended monitor put on suppressive therapy I simply asked her to come back and see me if she has bothersome palpitation.  I gave her a list of over-the-counter proarrhythmic medicines to avoid 2. See above, she has frequent APCs with minimal symptoms at this time I would not put on suppressive treatment 3. She assures me blood pressure outside my office is in range she will continue to take her current regimen of diuretic ARB.  Next appointment as needed   Medication Adjustments/Labs and Tests Ordered: Current medicines are reviewed at length with the patient today.  Concerns regarding medicines are outlined above.  No orders of the defined types were placed in this encounter.  No orders of the defined types were placed in this encounter.    Chief Complaint  Patient presents with  . New Patient (Initial Visit)    per Dr Casper HarrisonStreet to evaluate irregular heart beats    History of Present Illness:    Debra Harrison is a 77 y.o. female who is being seen today for the evaluation of an abnormal EKG at the request of Debra Harrison, *.  Neurology consult 08/30/17: Auscultation of the heart and palpation of the radial pulse showed skipped beats ?PVCs vs other cardiac arrythmia.  Recommend that she get EKG and follow-up with her PCP for this.   She had an EKG with a prolonged rhythm strip performed  09/06/2017.  The QRS morphology is normal she has frequent atrial premature beats and concern was raised whether she had heart block.  I had seen her 22 years ago when she had palpitation associated with her husband's death she wore monitor and apparently had no significant arrhythmia.  She has very little palpitation and presently is not taking a proarrhythmic drugs but have been on both gabapentin and amitriptyline in the past.  Otherwise she has done well without chest pain shortness of breath or syncope.  She is bothered by persistent back pain after spinal anesthesia radiating down her legs.  She has no known history of heart disease she does have a history of hypertension taking a diuretic and ARB.  Past Medical History:  Diagnosis Date  . Acid reflux   . Adult-onset obesity 09/09/2017  . Ankle edema 09/09/2017  . Benign essential hypertension 09/09/2017  . Benign paroxysmal vertigo 09/09/2017  . Bilateral carotid bruits 08/28/2016  . Borderline hyperglycemia 09/09/2017  . Cervical radiculopathy 08/28/2016  . Chest pain 09/09/2017  . Chronic renal disease, stage III (HCC) 09/09/2017  . Degenerative arthritis of hip   . Dyslipidemia 09/09/2017  . Dysphagia 09/09/2017  . GERD (gastroesophageal reflux disease) 09/09/2017  . Hypercholesterolemia   . Hypertension   . Idiopathic peripheral neuropathy 08/28/2016  . 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 Irregular heart rate 09/09/2017  . Localized osteoarthrosis, lower leg 09/09/2017  . Lower limb  length difference 03/12/2017  . Osteoarthritis   . Pernicious anemia 09/09/2017  . Second degree heart block 09/09/2017  . Spinal cord mass (HCC) 08/28/2016  . Thyroid nodule 09/11/2016   Noted on carotid dopplers through neurology 08/30/2016  . Total knee replacement status, left   . Vitamin B 12 deficiency 09/09/2017  . Wrist fracture 05/2001    Past Surgical History:  Procedure Laterality Date  . ABDOMINAL HYSTERECTOMY    . KNEE ARTHROSCOPY Right  05/2007  . left total knee arthroscopy  05/30/2016   Dr. Mardene Speak  . TOTAL HIP ARTHROPLASTY Right 02/01/2008    Current Medications: Current Meds  Medication Sig  . aspirin EC 81 MG tablet Take 81 mg by mouth daily.  . B Complex Vitamins (B COMPLEX 50 PO) Take by mouth.  . DULoxetine (CYMBALTA) 30 MG capsule Take 1 capsule (30 mg total) by mouth daily.  . furosemide (LASIX) 40 MG tablet Take 40 mg by mouth daily as needed.   Marland Kitchen losartan (COZAAR) 50 MG tablet Take 50 mg by mouth daily.     Allergies:   Methocarbamol; Tetracyclines & related; and Ace inhibitors   Social History   Socioeconomic History  . Marital status: Widowed    Spouse name: None  . Number of children: None  . Years of education: None  . Highest education level: None  Social Needs  . Financial resource strain: None  . Food insecurity - worry: None  . Food insecurity - inability: None  . Transportation needs - medical: None  . Transportation needs - non-medical: None  Occupational History  . None  Tobacco Use  . Smoking status: Former Smoker    Years: 15.00    Types: Cigarettes    Last attempt to quit: 1987    Years since quitting: 32.2  . Smokeless tobacco: Never Used  Substance and Sexual Activity  . Alcohol use: Yes    Comment: occasionally, on a less than daily basis  . Drug use: No  . Sexual activity: None  Other Topics Concern  . None  Social History Narrative  . None     Family History: The patient's family history includes AAA (abdominal aortic aneurysm) in her brother; Colon cancer in her mother; Congestive Heart Failure in her mother; Diabetes in her father and mother; Heart disease in her brother and father; Heart failure in her father; Hypertension in her father and mother; Irregular heart beat in her brother; Leukemia in her brother; Thyroid disease in her other.  ROS:   Review of Systems  Constitution: Positive for diaphoresis.  HENT: Negative.   Eyes: Negative.   Cardiovascular:  Positive for palpitations.  Respiratory: Positive for wheezing.   Endocrine: Negative.   Hematologic/Lymphatic: Negative.   Musculoskeletal: Positive for back pain.  Gastrointestinal: Negative.   Genitourinary: Negative.   Neurological: Positive for dizziness.   Please see the history of present illness.     All other systems reviewed and are negative.  EKGs/Labs/Other Studies Reviewed:    The following studies were reviewed today:  EKG 09/06/17 [facility] occasional pauses due to APC's nicely shown on the extended rhythm strip  Recent Labs: No results found for requested labs within last 8760 hours.  Recent Lipid Panel No results found for: CHOL, TRIG, HDL, CHOLHDL, VLDL, LDLCALC, LDLDIRECT  Physical Exam:    VS:  BP (!) 150/80 (BP Location: Right Leg, Patient Position: Sitting, Cuff Size: Normal)   Pulse 94   Ht 5\' 8"  (1.727 Harrison)   Wt  207 lb 12.8 oz (94.3 kg)   SpO2 95%   BMI 31.60 kg/Harrison     Wt Readings from Last 3 Encounters:  09/19/17 207 lb 12.8 oz (94.3 kg)  08/30/17 192 lb 4 oz (87.2 kg)     GEN:  Well nourished, well developed in no acute distress HEENT: Normal NECK: No JVD; No carotid bruits LYMPHATICS: No lymphadenopathy CARDIAC: RRR, no murmurs, rubs, gallops RESPIRATORY:  Clear to auscultation without rales, wheezing or rhonchi  ABDOMEN: Soft, non-tender, non-distended MUSCULOSKELETAL:  No edema; No deformity  SKIN: Warm and dry NEUROLOGIC:  Alert and oriented x 3 PSYCHIATRIC:  Normal affect     Signed, Norman Herrlich, MD  09/19/2017 3:32 PM    [facility]

## 2017-09-19 ENCOUNTER — Ambulatory Visit (INDEPENDENT_AMBULATORY_CARE_PROVIDER_SITE_OTHER): Payer: Medicare Other | Admitting: Cardiology

## 2017-09-19 ENCOUNTER — Ambulatory Visit: Payer: TRICARE For Life (TFL) | Admitting: Cardiology

## 2017-09-19 ENCOUNTER — Encounter: Payer: Self-pay | Admitting: Cardiology

## 2017-09-19 VITALS — BP 150/80 | HR 94 | Ht 68.0 in | Wt 207.8 lb

## 2017-09-19 DIAGNOSIS — I491 Atrial premature depolarization: Secondary | ICD-10-CM | POA: Diagnosis not present

## 2017-09-19 DIAGNOSIS — I10 Essential (primary) hypertension: Secondary | ICD-10-CM

## 2017-09-19 DIAGNOSIS — R9431 Abnormal electrocardiogram [ECG] [EKG]: Secondary | ICD-10-CM | POA: Diagnosis not present

## 2017-09-19 NOTE — Patient Instructions (Addendum)
Medication Instructions:  Your physician recommends that you continue on your current medications as directed. Please refer to the Current Medication list given to you today.  Labwork: None  Testing/Procedures: None  Follow-Up: Your physician recommends that you schedule a follow-up appointment as needed if symptoms worsen or fail to improve.  Any Other Special Instructions Will Be Listed Below (If Applicable).     If you need a refill on your cardiac medications before your next appointment, please call your pharmacy.    1. Avoid all over-the-counter antihistamines except Claritin/Loratadine and Zyrtec/Cetrizine. 2. Avoid all combination including cold sinus allergies flu decongestant and sleep medications 3. You can use Robitussin DM Mucinex and Mucinex DM for cough. 4. can use Tylenol aspirin ibuprofen and naproxen but no combinations such as sleep or sinus. 

## 2017-10-09 DIAGNOSIS — D510 Vitamin B12 deficiency anemia due to intrinsic factor deficiency: Secondary | ICD-10-CM | POA: Diagnosis not present

## 2017-10-16 DIAGNOSIS — I10 Essential (primary) hypertension: Secondary | ICD-10-CM | POA: Diagnosis not present

## 2017-10-16 DIAGNOSIS — R739 Hyperglycemia, unspecified: Secondary | ICD-10-CM | POA: Diagnosis not present

## 2017-10-16 DIAGNOSIS — M792 Neuralgia and neuritis, unspecified: Secondary | ICD-10-CM | POA: Diagnosis not present

## 2017-10-16 DIAGNOSIS — E785 Hyperlipidemia, unspecified: Secondary | ICD-10-CM | POA: Diagnosis not present

## 2017-10-16 DIAGNOSIS — I491 Atrial premature depolarization: Secondary | ICD-10-CM | POA: Diagnosis not present

## 2017-10-16 DIAGNOSIS — Z0000 Encounter for general adult medical examination without abnormal findings: Secondary | ICD-10-CM | POA: Diagnosis not present

## 2017-10-16 DIAGNOSIS — G609 Hereditary and idiopathic neuropathy, unspecified: Secondary | ICD-10-CM | POA: Diagnosis not present

## 2017-10-16 DIAGNOSIS — Z79899 Other long term (current) drug therapy: Secondary | ICD-10-CM | POA: Diagnosis not present

## 2017-10-16 DIAGNOSIS — N183 Chronic kidney disease, stage 3 (moderate): Secondary | ICD-10-CM | POA: Diagnosis not present

## 2017-11-06 DIAGNOSIS — D510 Vitamin B12 deficiency anemia due to intrinsic factor deficiency: Secondary | ICD-10-CM | POA: Diagnosis not present

## 2017-12-05 DIAGNOSIS — E538 Deficiency of other specified B group vitamins: Secondary | ICD-10-CM | POA: Diagnosis not present

## 2017-12-25 ENCOUNTER — Ambulatory Visit (INDEPENDENT_AMBULATORY_CARE_PROVIDER_SITE_OTHER): Payer: Medicare Other | Admitting: Neurology

## 2017-12-25 ENCOUNTER — Encounter: Payer: Self-pay | Admitting: Neurology

## 2017-12-25 VITALS — BP 130/90 | HR 66 | Ht 68.0 in | Wt 205.2 lb

## 2017-12-25 DIAGNOSIS — G959 Disease of spinal cord, unspecified: Secondary | ICD-10-CM | POA: Diagnosis not present

## 2017-12-25 DIAGNOSIS — M792 Neuralgia and neuritis, unspecified: Secondary | ICD-10-CM

## 2017-12-25 DIAGNOSIS — G9589 Other specified diseases of spinal cord: Secondary | ICD-10-CM

## 2017-12-25 MED ORDER — DULOXETINE HCL 30 MG PO CPEP: ORAL_CAPSULE | ORAL | 3 refills | Status: DC

## 2017-12-25 NOTE — Patient Instructions (Signed)
Increase cymbalta to 90mg  daily (take 30mg  + 60mg )  Please talk to your primary care doctor about seeing pain management

## 2017-12-25 NOTE — Progress Notes (Signed)
Follow-up Visit   Date: 12/25/17    Debra Harrison Lorincz MRN: [id number] DOB: 12-11-1940   Interim History: Debra Harrison Severe is a 77 y.o. . right-handed Caucasian female with hypertension, hyperlipidemia, and acid reflux returning to the clinic for follow-up of bilateral leg pain.  The patient was accompanied to the clinic by self.  History of present illness: On May 30 2016, she had left knee surgery and was getting epidural block and immediately developed sharp pain down her legs and knees.  She has constant pain in the legs since this time.  Pain is worse below the knees and especially in the feet.  It is described as burning, tingling, and numbness. She denies any weakness of the legs.  She has imbalance but is able to walk unassisted. MRI lumbar spine shows small 2-543mm hyperintensity in the posterior aspect of the conus-probably benign.  She was evaluated by [facility] Johnell [facility]ieden [facility].  She has previously been treated with gabapentin 600mg  TID and amitriptyline 50mg  at bedtime for bilateral feet paresthesias, which did not provide any relief so stopped this.  She takes tylenol 2-3 times per day.  UPDATE 12/25/2017:  She is here for follow-up visit.  She continues to have bilateral leg burning pain which constant and worse at night, often waking her up from sleeping.  Cymbalta helps her sleep and ease the pain some, but not enough where she gets adequate relief or rest.  She sleeps about 3-4 hours per night and is very frustrated with her pain. She denies any side effects to Cymbalta.  No new weakness or low back pain.   Medications:  Current Outpatient Medications on File Prior to Visit  Medication Sig Dispense Refill  . B Complex Vitamins (B COMPLEX 50 PO) Take by mouth.    . cyclobenzaprine (FLEXERIL) 10 MG tablet Take 10 mg by mouth 3 (three) times daily as needed for muscle spasms.    . DULoxetine (CYMBALTA) 60 MG capsule Take 60 mg by mouth daily.      . furosemide (LASIX) 40 MG tablet Take 40 mg by mouth daily as needed.     Marland Kitchen. losartan (COZAAR) 50 MG tablet Take 100 mg by mouth daily.     . DULoxetine (CYMBALTA) 30 MG capsule Take 1 capsule (30 mg total) by mouth daily. (Patient not taking: Reported on 12/25/2017) 30 capsule 5   No current facility-administered medications on file prior to visit.     Allergies:  Allergies  Allergen Reactions  . Methocarbamol Hives    ask   . Tetracyclines & Related Hives  . Ace Inhibitors Cough    Review of Systems:  CONSTITUTIONAL: No fevers, chills, night sweats, or weight loss.  EYES: No visual changes or eye pain ENT: No hearing changes.  No history of nose bleeds.   RESPIRATORY: No cough, wheezing and shortness of breath.   CARDIOVASCULAR: Negative for chest pain, and palpitations.   GI: Negative for abdominal discomfort, blood in stools or black stools.  No recent change in bowel habits.   GU:  No history of incontinence.   MUSCLOSKELETAL: No history of joint pain or swelling.  No myalgias.   SKIN: Negative for lesions, rash, and itching.   ENDOCRINE: Negative for cold or heat intolerance, polydipsia or goiter.   PSYCH:  No depression or anxiety symptoms.   NEURO: As Above.   Vital Signs:  BP 130/90   Pulse 66   Ht 5\' 8"  (  1.727 m)   Wt 205 lb 4 oz (93.1 kg)   SpO2 96%   BMI 31.21 kg/m    General Medical Exam:   General:  Well appearing, comfortable  Eyes/ENT: see cranial nerve examination.   Neck: No masses appreciated.  Full range of motion without tenderness.  No carotid bruits. Respiratory:  Clear to auscultation, good air entry bilaterally.   Cardiac:  Regular rate and rhythm, no murmur.   Ext:  No edema  Neurological Exam: MENTAL STATUS including orientation to time, place, person, recent and remote memory, attention span and concentration, language, and fund of knowledge is normal.  Speech is not dysarthric.  CRANIAL NERVES:  Pupils equal round and reactive to  light.  Normal conjugate, extra-ocular eye movements in all directions of gaze.  No ptosis.  Face is symmetric. Palate elevates symmetrically.  Tongue is midline.  MOTOR:  Motor strength is 5/5 in all extremities.  No pronator drift.  Tone is normal.    MSRs:  Reflexes are 2+/4 throughout.  SENSORY:  Hyperesthesia to pin prick and temperature below the knees.  Vibration intact.  COORDINATION/GAIT:  Intact rapid alternating movements bilaterally.  Gait slightly wide-based, stable.   Data: MRI lumbar spine without contrast 06/25/2016: Small, 2-3 mm focus of hyperintense T2 to weighted signal within the posterior aspect of the conus medullaris, without associated contrast enhancement. This appearance is nonspecific, but the characteristics are most suggestive of a benign etiology. 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  Mild lumbar degenerative changes, without significant spinal stenosis.  MRI lumbar spine 09/12/2017: 1. 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 2. Mild lumbar spine spondylosis as described above.  NCS/EMG of the legs 09/17/2017:   This is a normal study of the lower extremities. In particular, there is no evidence of a large fiber sensorimotor polyneuropathy or lumbosacral radiculopathy.  IMPRESSION/PLAN: Bilateral leg pain and paresthesia following epidural nerve block for knee surgery in November 2017.  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  NCS/EMG of the legs is normal, excluding neuropathy as a cause.  We discussed that neurological recovery is greatest within  the first year of nerve injury, after which symptoms are most likely lasting deficits.  She is understandably frustrated with the quality of life because of the severity of her pain.  Unfortunately, she has been on many medications including Lyrica 150mg /d (side effects), gabapentin 2400mg /d (ineffective), amitriptyline 50mg  (ineffective) without benefit.  I recommend increasing Cymbalta to 90mg  daily and recommend that she see pain management for other options.   She will discuss with her PCP for referral to pain management.   Thank you for allowing me to participate in patient's care.  If I can answer any additional questions, I would be pleased to do so.    Sincerely,    Donika K. Allena Katz, DO

## 2017-12-26 DIAGNOSIS — T8484XA Pain due to internal orthopedic prosthetic devices, implants and grafts, initial encounter: Secondary | ICD-10-CM | POA: Diagnosis not present

## 2018-01-06 DIAGNOSIS — E538 Deficiency of other specified B group vitamins: Secondary | ICD-10-CM | POA: Diagnosis not present

## 2018-01-20 DIAGNOSIS — Z96652 Presence of left artificial knee joint: Secondary | ICD-10-CM | POA: Diagnosis not present

## 2018-01-20 DIAGNOSIS — M1712 Unilateral primary osteoarthritis, left knee: Secondary | ICD-10-CM | POA: Diagnosis not present

## 2018-02-07 DIAGNOSIS — D510 Vitamin B12 deficiency anemia due to intrinsic factor deficiency: Secondary | ICD-10-CM | POA: Diagnosis not present

## 2018-03-06 DIAGNOSIS — M25562 Pain in left knee: Secondary | ICD-10-CM | POA: Diagnosis not present

## 2018-03-11 DIAGNOSIS — D510 Vitamin B12 deficiency anemia due to intrinsic factor deficiency: Secondary | ICD-10-CM | POA: Diagnosis not present

## 2018-04-07 DIAGNOSIS — D510 Vitamin B12 deficiency anemia due to intrinsic factor deficiency: Secondary | ICD-10-CM | POA: Diagnosis not present

## 2018-04-30 DIAGNOSIS — G8929 Other chronic pain: Secondary | ICD-10-CM | POA: Diagnosis not present

## 2018-04-30 DIAGNOSIS — G90529 Complex regional pain syndrome I of unspecified lower limb: Secondary | ICD-10-CM | POA: Diagnosis not present

## 2018-04-30 DIAGNOSIS — M25562 Pain in left knee: Secondary | ICD-10-CM | POA: Diagnosis not present

## 2018-05-12 DIAGNOSIS — G5793 Unspecified mononeuropathy of bilateral lower limbs: Secondary | ICD-10-CM | POA: Diagnosis not present

## 2018-05-12 DIAGNOSIS — G90529 Complex regional pain syndrome I of unspecified lower limb: Secondary | ICD-10-CM | POA: Diagnosis not present

## 2018-05-12 DIAGNOSIS — E538 Deficiency of other specified B group vitamins: Secondary | ICD-10-CM | POA: Diagnosis not present

## 2018-05-12 DIAGNOSIS — Z23 Encounter for immunization: Secondary | ICD-10-CM | POA: Diagnosis not present

## 2018-05-12 DIAGNOSIS — Z6832 Body mass index (BMI) 32.0-32.9, adult: Secondary | ICD-10-CM | POA: Diagnosis not present

## 2018-05-20 DIAGNOSIS — G8921 Chronic pain due to trauma: Secondary | ICD-10-CM | POA: Diagnosis not present

## 2018-05-20 DIAGNOSIS — G90521 Complex regional pain syndrome I of right lower limb: Secondary | ICD-10-CM | POA: Diagnosis not present

## 2018-05-27 DIAGNOSIS — G90521 Complex regional pain syndrome I of right lower limb: Secondary | ICD-10-CM | POA: Diagnosis not present

## 2018-06-03 DIAGNOSIS — G90529 Complex regional pain syndrome I of unspecified lower limb: Secondary | ICD-10-CM | POA: Diagnosis not present

## 2018-06-03 DIAGNOSIS — G8929 Other chronic pain: Secondary | ICD-10-CM | POA: Diagnosis not present

## 2018-06-16 DIAGNOSIS — D510 Vitamin B12 deficiency anemia due to intrinsic factor deficiency: Secondary | ICD-10-CM | POA: Diagnosis not present

## 2018-06-19 DIAGNOSIS — G90529 Complex regional pain syndrome I of unspecified lower limb: Secondary | ICD-10-CM | POA: Diagnosis not present

## 2018-06-26 DIAGNOSIS — G8929 Other chronic pain: Secondary | ICD-10-CM | POA: Diagnosis not present

## 2018-06-26 DIAGNOSIS — G90529 Complex regional pain syndrome I of unspecified lower limb: Secondary | ICD-10-CM | POA: Diagnosis not present

## 2018-07-03 DIAGNOSIS — G90529 Complex regional pain syndrome I of unspecified lower limb: Secondary | ICD-10-CM | POA: Diagnosis not present

## 2018-07-28 DIAGNOSIS — G90529 Complex regional pain syndrome I of unspecified lower limb: Secondary | ICD-10-CM | POA: Diagnosis not present

## 2018-07-28 DIAGNOSIS — G8929 Other chronic pain: Secondary | ICD-10-CM | POA: Diagnosis not present

## 2018-07-28 DIAGNOSIS — M1711 Unilateral primary osteoarthritis, right knee: Secondary | ICD-10-CM | POA: Diagnosis not present

## 2018-08-05 DIAGNOSIS — D510 Vitamin B12 deficiency anemia due to intrinsic factor deficiency: Secondary | ICD-10-CM | POA: Diagnosis not present

## 2018-08-07 DIAGNOSIS — M1711 Unilateral primary osteoarthritis, right knee: Secondary | ICD-10-CM | POA: Diagnosis not present

## 2018-08-19 IMAGING — MR MR LUMBAR SPINE WO/W CM
4 of 7 series · 16 of 48 positions shown · IV contrast (Multihance 18ml)
Comparison: 06/21/2016

CLINICAL DATA: Bilateral leg and foot numbness. Right worse than
left.

EXAM:
MRI LUMBAR SPINE WITHOUT AND WITH CONTRAST
TECHNIQUE: Multiplanar and multiecho pulse sequences of the lumbar spine were
obtained without and with intravenous contrast.
CONTRAST:  18 mL MultiHance

[Series 8: T1 · sagittal · 4.0mm · 0.73mm/px · 3 of 15 slices shown (1 of 2)]
[im 1/15]
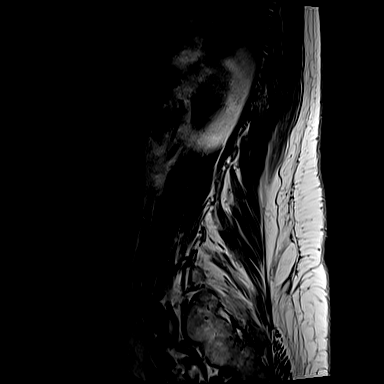
[im 10/15]
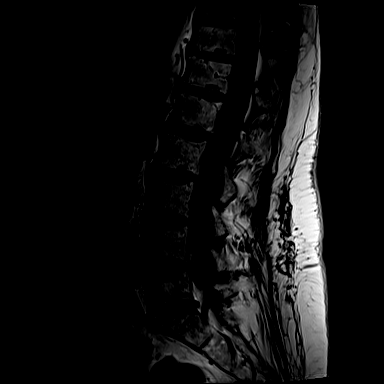
[im 15/15]
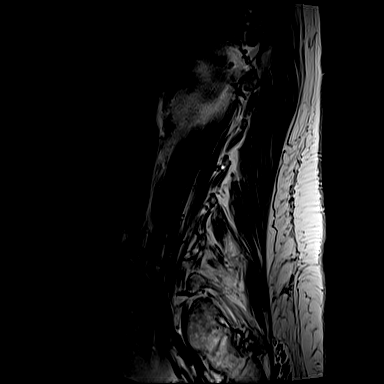

[Series 11: T1 · axial · 4.0mm · 0.28mm/px · z∈[-34,+164]mm · 3 of 46 slices shown (2 of 2)]
[im 5/46]
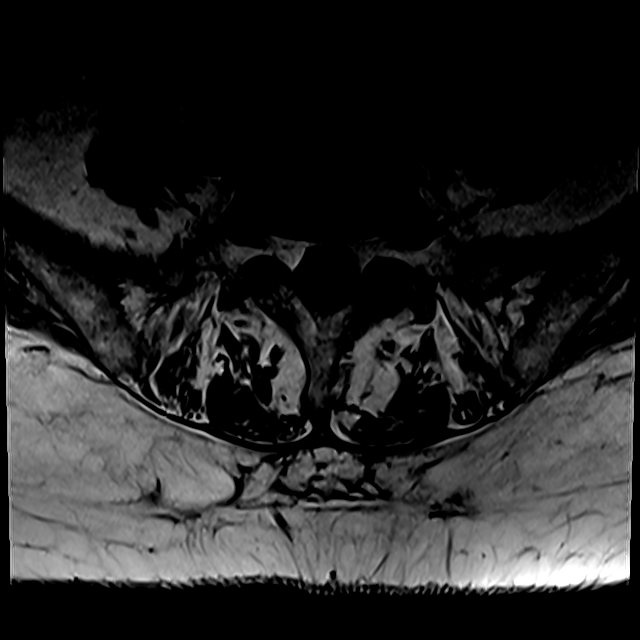
[im 23/46]
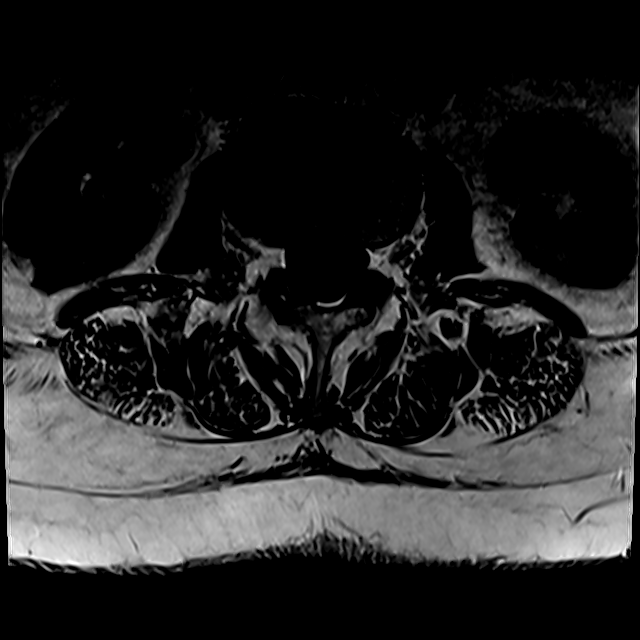
[im 41/46]
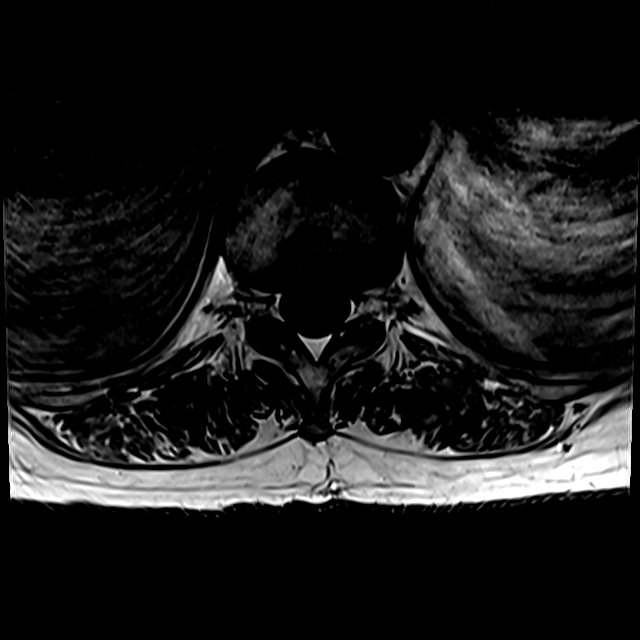

[Series 14: T2 · axial · 4.0mm · 0.28mm/px · z∈[-54,+164]mm · 7 of 46 slices shown (1 of 2)]
[im 1/46]
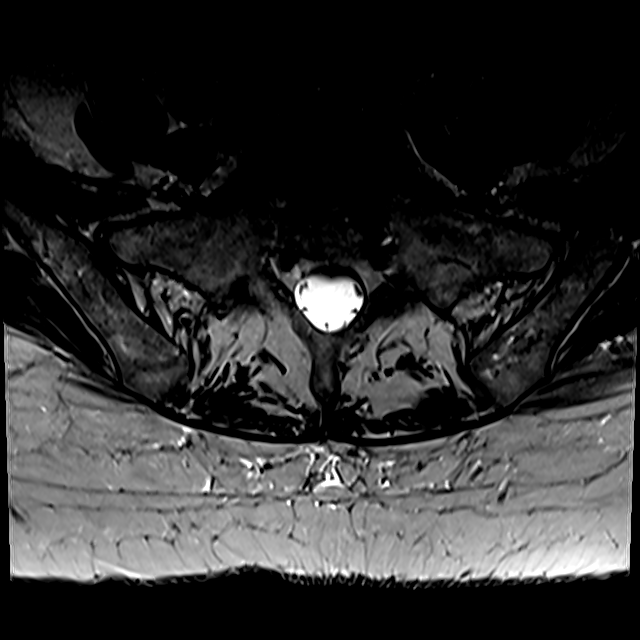
[im 5/46]
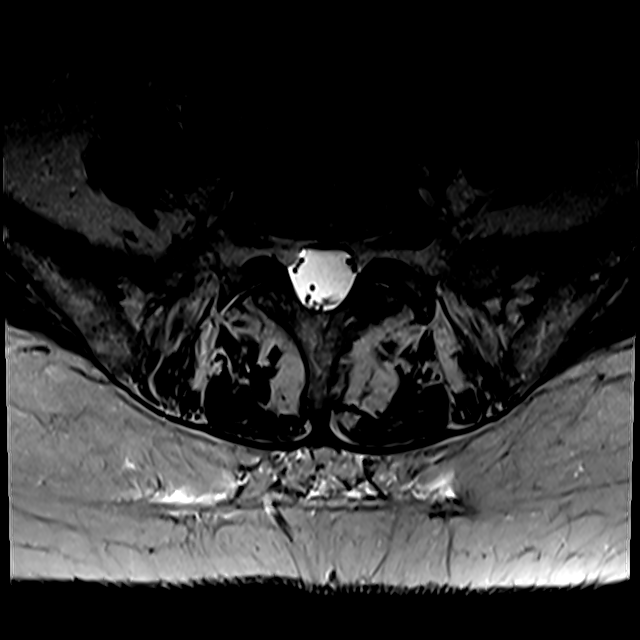
[im 10/46]
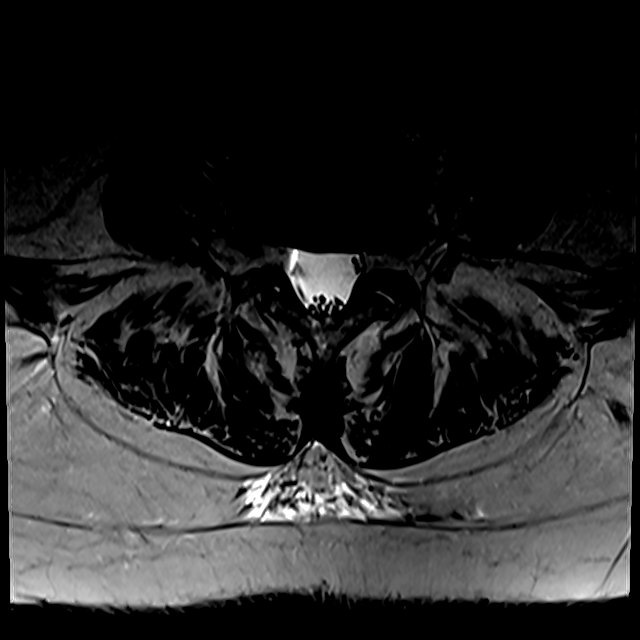
[im 14/46]
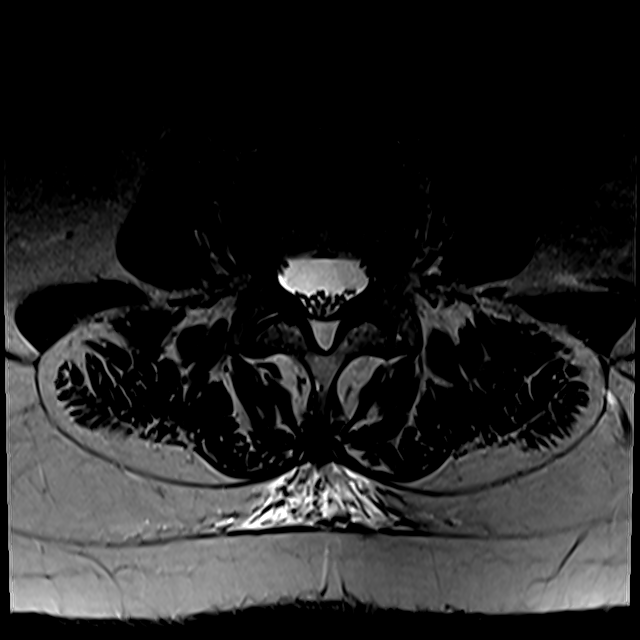
[im 19/46]
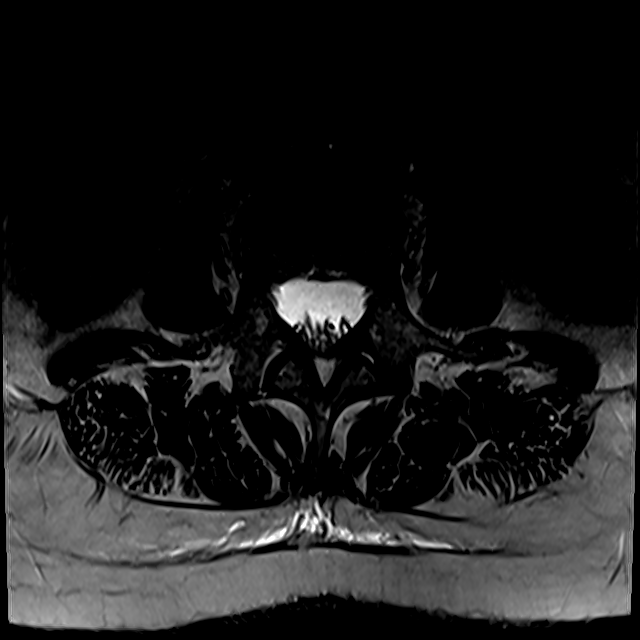
[im 23/46]
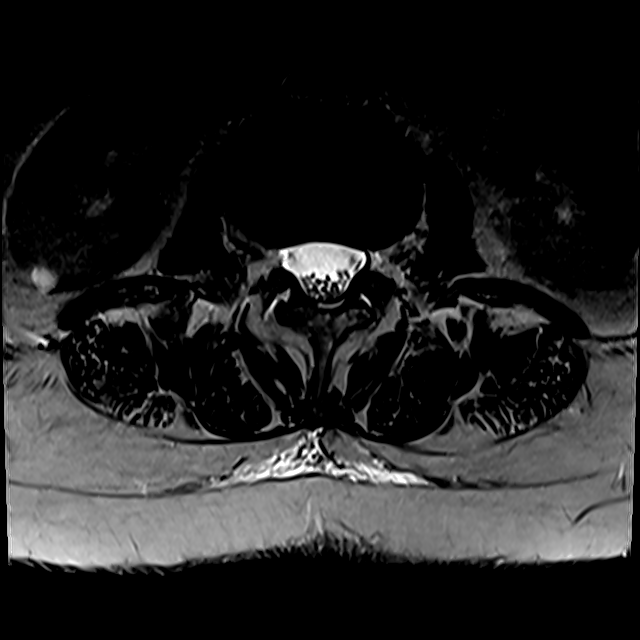
[im 41/46]
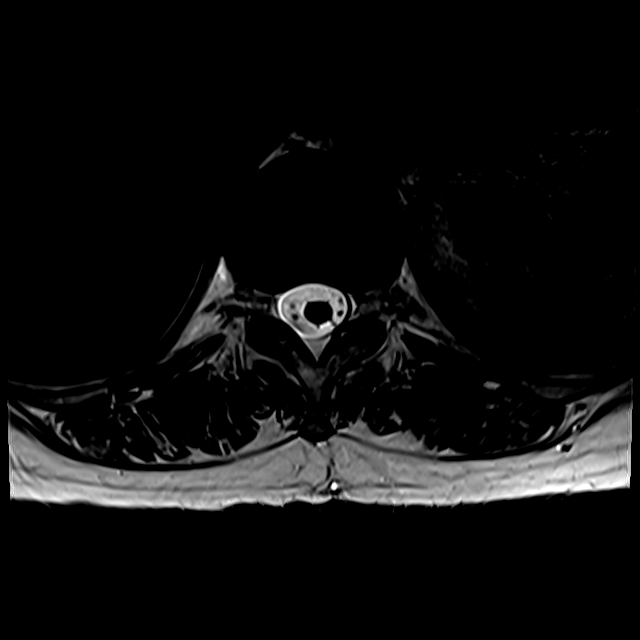

[Series 15: T2 · sagittal · 4.0mm · 0.73mm/px · 3 of 15 slices shown (2 of 2)]
[im 1/15]
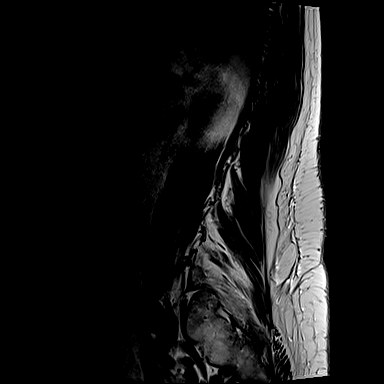
[im 10/15]
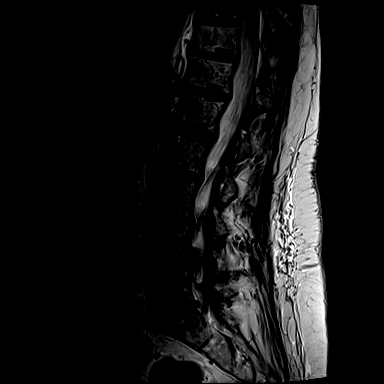
[im 15/15]
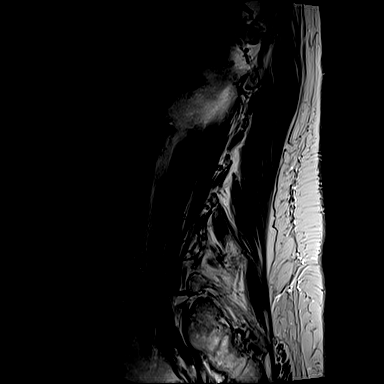

[16 of 48 positions shown; findings below may reference images not displayed]

FINDINGS: Segmentation:  Standard.

Alignment:  Physiologic.

Vertebrae:  No fracture, evidence of discitis, or bone lesion.

Conus medullaris and cauda equina: Conus extends to the L1 level.
Focal rounded subcentimeter T2 hyperintense focus along the right
posterior aspect of the conus medullaris measuring approximately 8
mm in craniocaudal dimension demonstrates no enhancement on
postcontrast imaging and suggests a small terminal ventricle
remanent or cyst of the conus medullaris. Conus and cauda equina
appear otherwise normal.

Paraspinal and other soft tissues: No paraspinal abnormality.

Disc levels:

Disc spaces: Degenerative disc disease with disc height loss at
L5-S1.

T12-L1: No significant disc bulge. No evidence of neural foraminal
stenosis. No central canal stenosis.

L1-L2: Small left paracentral disc protrusion. No evidence of neural
foraminal stenosis. No central canal stenosis.

L2-L3: Mild broad-based disc bulge. Mild bilateral facet
arthropathy. No evidence of neural foraminal stenosis. No central
canal stenosis.

L3-L4: Mild broad-based disc bulge. Mild bilateral facet
arthropathy. No evidence of neural foraminal stenosis. No central
canal stenosis.

L4-L5: Mild broad-based disc bulge. No evidence of neural foraminal
stenosis. No central canal stenosis.

L5-S1: Broad-based disc bulge. No evidence of neural foraminal
stenosis. No central canal stenosis.
IMPRESSION: 1. Stable tiny focal rounded subcentimeter T2 hyperintense focus
along the right posterior aspect of the conus medullaris measuring
approximately 8 mm in craniocaudal dimension demonstrates no
enhancement on postcontrast imaging and suggests a small terminal
ventricle remanent or cyst of the conus medullaris.
2. Mild lumbar spine spondylosis as described above.

## 2018-08-25 DIAGNOSIS — M792 Neuralgia and neuritis, unspecified: Secondary | ICD-10-CM | POA: Diagnosis not present

## 2018-09-10 DIAGNOSIS — D510 Vitamin B12 deficiency anemia due to intrinsic factor deficiency: Secondary | ICD-10-CM | POA: Diagnosis not present

## 2018-11-06 DIAGNOSIS — G609 Hereditary and idiopathic neuropathy, unspecified: Secondary | ICD-10-CM | POA: Diagnosis not present

## 2018-11-06 DIAGNOSIS — G5793 Unspecified mononeuropathy of bilateral lower limbs: Secondary | ICD-10-CM | POA: Diagnosis not present

## 2018-11-06 DIAGNOSIS — G90529 Complex regional pain syndrome I of unspecified lower limb: Secondary | ICD-10-CM | POA: Diagnosis not present

## 2018-12-03 DIAGNOSIS — G90529 Complex regional pain syndrome I of unspecified lower limb: Secondary | ICD-10-CM | POA: Diagnosis not present

## 2018-12-03 DIAGNOSIS — Z0000 Encounter for general adult medical examination without abnormal findings: Secondary | ICD-10-CM | POA: Diagnosis not present

## 2018-12-03 DIAGNOSIS — R739 Hyperglycemia, unspecified: Secondary | ICD-10-CM | POA: Diagnosis not present

## 2018-12-03 DIAGNOSIS — G609 Hereditary and idiopathic neuropathy, unspecified: Secondary | ICD-10-CM | POA: Diagnosis not present

## 2018-12-03 DIAGNOSIS — I10 Essential (primary) hypertension: Secondary | ICD-10-CM | POA: Diagnosis not present

## 2018-12-03 DIAGNOSIS — E785 Hyperlipidemia, unspecified: Secondary | ICD-10-CM | POA: Diagnosis not present

## 2018-12-16 DIAGNOSIS — Z01818 Encounter for other preprocedural examination: Secondary | ICD-10-CM | POA: Diagnosis not present

## 2018-12-16 DIAGNOSIS — H2512 Age-related nuclear cataract, left eye: Secondary | ICD-10-CM | POA: Diagnosis not present

## 2018-12-16 DIAGNOSIS — H40013 Open angle with borderline findings, low risk, bilateral: Secondary | ICD-10-CM | POA: Diagnosis not present

## 2018-12-23 DIAGNOSIS — F17200 Nicotine dependence, unspecified, uncomplicated: Secondary | ICD-10-CM | POA: Diagnosis not present

## 2018-12-23 DIAGNOSIS — H2512 Age-related nuclear cataract, left eye: Secondary | ICD-10-CM | POA: Diagnosis not present

## 2018-12-23 DIAGNOSIS — H40013 Open angle with borderline findings, low risk, bilateral: Secondary | ICD-10-CM | POA: Diagnosis not present

## 2018-12-23 DIAGNOSIS — E039 Hypothyroidism, unspecified: Secondary | ICD-10-CM | POA: Diagnosis not present

## 2018-12-23 DIAGNOSIS — I10 Essential (primary) hypertension: Secondary | ICD-10-CM | POA: Diagnosis not present

## 2018-12-23 DIAGNOSIS — Z79899 Other long term (current) drug therapy: Secondary | ICD-10-CM | POA: Diagnosis not present

## 2018-12-23 DIAGNOSIS — H259 Unspecified age-related cataract: Secondary | ICD-10-CM | POA: Diagnosis not present

## 2018-12-23 DIAGNOSIS — E7800 Pure hypercholesterolemia, unspecified: Secondary | ICD-10-CM | POA: Diagnosis not present

## 2018-12-23 DIAGNOSIS — H353131 Nonexudative age-related macular degeneration, bilateral, early dry stage: Secondary | ICD-10-CM | POA: Diagnosis not present

## 2018-12-23 DIAGNOSIS — K219 Gastro-esophageal reflux disease without esophagitis: Secondary | ICD-10-CM | POA: Diagnosis not present

## 2018-12-30 DIAGNOSIS — E538 Deficiency of other specified B group vitamins: Secondary | ICD-10-CM | POA: Diagnosis not present

## 2019-01-20 DIAGNOSIS — H353131 Nonexudative age-related macular degeneration, bilateral, early dry stage: Secondary | ICD-10-CM | POA: Diagnosis not present

## 2019-01-27 DIAGNOSIS — H353131 Nonexudative age-related macular degeneration, bilateral, early dry stage: Secondary | ICD-10-CM | POA: Diagnosis not present

## 2019-01-27 DIAGNOSIS — I10 Essential (primary) hypertension: Secondary | ICD-10-CM | POA: Diagnosis not present

## 2019-01-27 DIAGNOSIS — Z87891 Personal history of nicotine dependence: Secondary | ICD-10-CM | POA: Diagnosis not present

## 2019-01-27 DIAGNOSIS — H2511 Age-related nuclear cataract, right eye: Secondary | ICD-10-CM | POA: Diagnosis not present

## 2019-01-27 DIAGNOSIS — H259 Unspecified age-related cataract: Secondary | ICD-10-CM | POA: Diagnosis not present

## 2019-01-27 DIAGNOSIS — E785 Hyperlipidemia, unspecified: Secondary | ICD-10-CM | POA: Diagnosis not present

## 2019-02-09 DIAGNOSIS — E538 Deficiency of other specified B group vitamins: Secondary | ICD-10-CM | POA: Diagnosis not present

## 2019-02-25 DIAGNOSIS — M792 Neuralgia and neuritis, unspecified: Secondary | ICD-10-CM | POA: Diagnosis not present

## 2019-03-12 DIAGNOSIS — D519 Vitamin B12 deficiency anemia, unspecified: Secondary | ICD-10-CM | POA: Diagnosis not present

## 2019-03-19 DIAGNOSIS — M170 Bilateral primary osteoarthritis of knee: Secondary | ICD-10-CM | POA: Diagnosis not present

## 2019-03-19 DIAGNOSIS — I10 Essential (primary) hypertension: Secondary | ICD-10-CM | POA: Diagnosis not present

## 2019-03-19 DIAGNOSIS — G90529 Complex regional pain syndrome I of unspecified lower limb: Secondary | ICD-10-CM | POA: Diagnosis not present

## 2019-03-19 DIAGNOSIS — G609 Hereditary and idiopathic neuropathy, unspecified: Secondary | ICD-10-CM | POA: Diagnosis not present

## 2019-04-14 DIAGNOSIS — Z23 Encounter for immunization: Secondary | ICD-10-CM | POA: Diagnosis not present

## 2019-04-14 DIAGNOSIS — D519 Vitamin B12 deficiency anemia, unspecified: Secondary | ICD-10-CM | POA: Diagnosis not present

## 2019-05-12 DIAGNOSIS — D519 Vitamin B12 deficiency anemia, unspecified: Secondary | ICD-10-CM | POA: Diagnosis not present

## 2019-08-10 DIAGNOSIS — E538 Deficiency of other specified B group vitamins: Secondary | ICD-10-CM | POA: Diagnosis not present

## 2019-08-13 DIAGNOSIS — G90529 Complex regional pain syndrome I of unspecified lower limb: Secondary | ICD-10-CM | POA: Diagnosis not present

## 2019-09-07 DIAGNOSIS — E538 Deficiency of other specified B group vitamins: Secondary | ICD-10-CM | POA: Diagnosis not present

## 2019-09-12 DIAGNOSIS — Z23 Encounter for immunization: Secondary | ICD-10-CM | POA: Diagnosis not present

## 2019-09-22 DIAGNOSIS — G90529 Complex regional pain syndrome I of unspecified lower limb: Secondary | ICD-10-CM | POA: Diagnosis not present

## 2019-10-12 DIAGNOSIS — Z23 Encounter for immunization: Secondary | ICD-10-CM | POA: Diagnosis not present

## 2019-11-09 DIAGNOSIS — E538 Deficiency of other specified B group vitamins: Secondary | ICD-10-CM | POA: Diagnosis not present

## 2019-11-12 DIAGNOSIS — G90529 Complex regional pain syndrome I of unspecified lower limb: Secondary | ICD-10-CM | POA: Diagnosis not present

## 2019-12-21 DIAGNOSIS — D519 Vitamin B12 deficiency anemia, unspecified: Secondary | ICD-10-CM | POA: Diagnosis not present

## 2020-02-09 DIAGNOSIS — G90521 Complex regional pain syndrome I of right lower limb: Secondary | ICD-10-CM | POA: Diagnosis not present

## 2020-02-23 DIAGNOSIS — G90521 Complex regional pain syndrome I of right lower limb: Secondary | ICD-10-CM | POA: Diagnosis not present

## 2020-02-24 DIAGNOSIS — D519 Vitamin B12 deficiency anemia, unspecified: Secondary | ICD-10-CM | POA: Diagnosis not present

## 2020-03-01 DIAGNOSIS — G90529 Complex regional pain syndrome I of unspecified lower limb: Secondary | ICD-10-CM | POA: Diagnosis not present

## 2020-03-10 DIAGNOSIS — G90521 Complex regional pain syndrome I of right lower limb: Secondary | ICD-10-CM | POA: Diagnosis not present

## 2020-03-17 DIAGNOSIS — D519 Vitamin B12 deficiency anemia, unspecified: Secondary | ICD-10-CM | POA: Diagnosis not present

## 2020-03-30 DIAGNOSIS — S20211A Contusion of right front wall of thorax, initial encounter: Secondary | ICD-10-CM | POA: Diagnosis not present

## 2020-03-30 DIAGNOSIS — W19XXXA Unspecified fall, initial encounter: Secondary | ICD-10-CM | POA: Diagnosis not present

## 2020-03-30 DIAGNOSIS — Z6833 Body mass index (BMI) 33.0-33.9, adult: Secondary | ICD-10-CM | POA: Diagnosis not present

## 2020-03-30 DIAGNOSIS — R0781 Pleurodynia: Secondary | ICD-10-CM | POA: Diagnosis not present

## 2020-04-15 DIAGNOSIS — Z23 Encounter for immunization: Secondary | ICD-10-CM | POA: Diagnosis not present

## 2020-04-15 DIAGNOSIS — E538 Deficiency of other specified B group vitamins: Secondary | ICD-10-CM | POA: Diagnosis not present

## 2020-04-15 DIAGNOSIS — S20211D Contusion of right front wall of thorax, subsequent encounter: Secondary | ICD-10-CM | POA: Diagnosis not present

## 2020-04-15 DIAGNOSIS — S79911D Unspecified injury of right hip, subsequent encounter: Secondary | ICD-10-CM | POA: Diagnosis not present

## 2020-04-15 DIAGNOSIS — I491 Atrial premature depolarization: Secondary | ICD-10-CM | POA: Diagnosis not present

## 2020-05-30 DIAGNOSIS — R7302 Impaired glucose tolerance (oral): Secondary | ICD-10-CM | POA: Diagnosis not present

## 2020-05-30 DIAGNOSIS — E785 Hyperlipidemia, unspecified: Secondary | ICD-10-CM | POA: Diagnosis not present

## 2020-05-30 DIAGNOSIS — Z79899 Other long term (current) drug therapy: Secondary | ICD-10-CM | POA: Diagnosis not present

## 2020-05-30 DIAGNOSIS — E538 Deficiency of other specified B group vitamins: Secondary | ICD-10-CM | POA: Diagnosis not present

## 2020-05-30 DIAGNOSIS — G90529 Complex regional pain syndrome I of unspecified lower limb: Secondary | ICD-10-CM | POA: Diagnosis not present

## 2020-05-30 DIAGNOSIS — Z0000 Encounter for general adult medical examination without abnormal findings: Secondary | ICD-10-CM | POA: Diagnosis not present

## 2020-05-30 DIAGNOSIS — M170 Bilateral primary osteoarthritis of knee: Secondary | ICD-10-CM | POA: Diagnosis not present

## 2020-05-30 DIAGNOSIS — Z23 Encounter for immunization: Secondary | ICD-10-CM | POA: Diagnosis not present

## 2020-05-30 DIAGNOSIS — G608 Other hereditary and idiopathic neuropathies: Secondary | ICD-10-CM | POA: Diagnosis not present

## 2020-06-27 DIAGNOSIS — E538 Deficiency of other specified B group vitamins: Secondary | ICD-10-CM | POA: Diagnosis not present

## 2020-07-07 DIAGNOSIS — M1711 Unilateral primary osteoarthritis, right knee: Secondary | ICD-10-CM | POA: Diagnosis not present

## 2020-08-30 DIAGNOSIS — E538 Deficiency of other specified B group vitamins: Secondary | ICD-10-CM | POA: Diagnosis not present

## 2020-09-27 DIAGNOSIS — E538 Deficiency of other specified B group vitamins: Secondary | ICD-10-CM | POA: Diagnosis not present

## 2020-10-25 DIAGNOSIS — G90521 Complex regional pain syndrome I of right lower limb: Secondary | ICD-10-CM | POA: Diagnosis not present

## 2020-10-27 DIAGNOSIS — I491 Atrial premature depolarization: Secondary | ICD-10-CM | POA: Diagnosis not present

## 2020-10-27 DIAGNOSIS — I471 Supraventricular tachycardia: Secondary | ICD-10-CM | POA: Diagnosis not present

## 2020-10-27 DIAGNOSIS — E538 Deficiency of other specified B group vitamins: Secondary | ICD-10-CM | POA: Diagnosis not present

## 2020-10-27 DIAGNOSIS — I498 Other specified cardiac arrhythmias: Secondary | ICD-10-CM | POA: Diagnosis not present

## 2020-10-27 DIAGNOSIS — I441 Atrioventricular block, second degree: Secondary | ICD-10-CM | POA: Diagnosis not present

## 2020-10-31 DIAGNOSIS — K219 Gastro-esophageal reflux disease without esophagitis: Secondary | ICD-10-CM | POA: Insufficient documentation

## 2020-10-31 DIAGNOSIS — M1990 Unspecified osteoarthritis, unspecified site: Secondary | ICD-10-CM | POA: Insufficient documentation

## 2020-10-31 DIAGNOSIS — I10 Essential (primary) hypertension: Secondary | ICD-10-CM | POA: Insufficient documentation

## 2020-10-31 DIAGNOSIS — M169 Osteoarthritis of hip, unspecified: Secondary | ICD-10-CM | POA: Insufficient documentation

## 2020-10-31 DIAGNOSIS — E7800 Pure hypercholesterolemia, unspecified: Secondary | ICD-10-CM | POA: Insufficient documentation

## 2020-10-31 DIAGNOSIS — Z96652 Presence of left artificial knee joint: Secondary | ICD-10-CM | POA: Insufficient documentation

## 2020-11-01 DIAGNOSIS — G90521 Complex regional pain syndrome I of right lower limb: Secondary | ICD-10-CM | POA: Diagnosis not present

## 2020-11-02 ENCOUNTER — Other Ambulatory Visit: Payer: Self-pay

## 2020-11-02 ENCOUNTER — Ambulatory Visit (INDEPENDENT_AMBULATORY_CARE_PROVIDER_SITE_OTHER): Payer: Medicare Other | Admitting: Cardiology

## 2020-11-02 ENCOUNTER — Ambulatory Visit (INDEPENDENT_AMBULATORY_CARE_PROVIDER_SITE_OTHER): Payer: Medicare Other

## 2020-11-02 VITALS — BP 160/62 | HR 60 | Ht 68.0 in | Wt 213.0 lb

## 2020-11-02 DIAGNOSIS — Z96652 Presence of left artificial knee joint: Secondary | ICD-10-CM | POA: Diagnosis not present

## 2020-11-02 DIAGNOSIS — R0609 Other forms of dyspnea: Secondary | ICD-10-CM

## 2020-11-02 DIAGNOSIS — E785 Hyperlipidemia, unspecified: Secondary | ICD-10-CM | POA: Diagnosis not present

## 2020-11-02 DIAGNOSIS — R42 Dizziness and giddiness: Secondary | ICD-10-CM | POA: Diagnosis not present

## 2020-11-02 DIAGNOSIS — R0600 Dyspnea, unspecified: Secondary | ICD-10-CM | POA: Diagnosis not present

## 2020-11-02 DIAGNOSIS — R002 Palpitations: Secondary | ICD-10-CM | POA: Insufficient documentation

## 2020-11-02 NOTE — Patient Instructions (Signed)
Medication Instructions:  Your physician recommends that you continue on your current medications as directed. Please refer to the Current Medication list given to you today.  *If you need a refill on your cardiac medications before your next appointment, please call your pharmacy*   Lab Work: None If you have labs (blood work) drawn today and your tests are completely normal, you will receive your results only by: . MyChart Message (if you have MyChart) OR . A paper copy in the mail If you have any lab test that is abnormal or we need to change your treatment, we will call you to review the results.   Testing/Procedures: Your physician has requested that you have an echocardiogram. Echocardiography is a painless test that uses sound waves to create images of your heart. It provides your doctor with information about the size and shape of your heart and how well your heart's chambers and valves are working. This procedure takes approximately one hour. There are no restrictions for this procedure.  A zio monitor was ordered today. It will remain on for 14 days. You will then return monitor and event diary in provided box. It takes 1-2 weeks for report to be downloaded and returned to us. We will call you with the results. If monitor falls off or has orange flashing light, please call Zio for further instructions.      Follow-Up: At [facility], you and your health needs are our priority.  As part of our continuing mission to provide you with exceptional heart care, we have created designated Provider Care Teams.  These Care Teams include your primary Cardiologist (physician) and Advanced Practice Providers (APPs -  Physician Assistants and Nurse Practitioners) who all work together to provide you with the care you need, when you need it.  We recommend signing up for the patient portal called "MyChart".  Sign up information is provided on this After Visit Summary.  MyChart is used to  connect with patients for Virtual Visits (Telemedicine).  Patients are able to view lab/test results, encounter notes, upcoming appointments, etc.  Non-urgent messages can be sent to your provider as well.   To learn more about what you can do with MyChart, go to [email or web address].    Your next appointment:   6 week(s)  The format for your next appointment:   In Person  Provider:   Robert Krasowski, MD   Other Instructions   Echocardiogram An echocardiogram is a test that uses sound waves (ultrasound) to produce images of the heart. Images from an echocardiogram can provide important information about:  Heart size and shape.  The size and thickness and movement of your heart's walls.  Heart muscle function and strength.  Heart valve function or if you have stenosis. Stenosis is when the heart valves are too narrow.  If blood is flowing backward through the heart valves (regurgitation).  A tumor or infectious growth around the heart valves.  Areas of heart muscle that are not working well because of poor blood flow or injury from a heart attack.  Aneurysm detection. An aneurysm is a weak or damaged part of an artery wall. The wall bulges out from the normal force of blood pumping through the body. Tell a health care provider about:  Any allergies you have.  All medicines you are taking, including vitamins, herbs, eye drops, creams, and over-the-counter medicines.  Any blood disorders you have.  Any surgeries you have had.  Any medical conditions you have.  Whether you   are pregnant or may be pregnant. What are the risks? Generally, this is a safe test. However, problems may occur, including an allergic reaction to dye (contrast) that may be used during the test. What happens before the test? No specific preparation is needed. You may eat and drink normally. What happens during the test?  You will take off your clothes from the waist up and put on a hospital  gown.  Electrodes or electrocardiogram (ECG)patches may be placed on your chest. The electrodes or patches are then connected to a device that monitors your heart rate and rhythm.  You will lie down on a table for an ultrasound exam. A gel will be applied to your chest to help sound waves pass through your skin.  A handheld device, called a transducer, will be pressed against your chest and moved over your heart. The transducer produces sound waves that travel to your heart and bounce back (or "echo" back) to the transducer. These sound waves will be captured in real-time and changed into images of your heart that can be viewed on a video monitor. The images will be recorded on a computer and reviewed by your health care provider.  You may be asked to change positions or hold your breath for a short time. This makes it easier to get different views or better views of your heart.  In some cases, you may receive contrast through an IV in one of your veins. This can improve the quality of the pictures from your heart. The procedure may vary among health care providers and hospitals.   What can I expect after the test? You may return to your normal, everyday life, including diet, activities, and medicines, unless your health care provider tells you not to do that. Follow these instructions at home:  It is up to you to get the results of your test. Ask your health care provider, or the department that is doing the test, when your results will be ready.  Keep all follow-up visits. This is important. Summary  An echocardiogram is a test that uses sound waves (ultrasound) to produce images of the heart.  Images from an echocardiogram can provide important information about the size and shape of your heart, heart muscle function, heart valve function, and other possible heart problems.  You do not need to do anything to prepare before this test. You may eat and drink normally.  After the  echocardiogram is completed, you may return to your normal, everyday life, unless your health care provider tells you not to do that. This information is not intended to replace advice given to you by your health care provider. Make sure you discuss any questions you have with your health care provider. Document Revised: 02/23/2020 Document Reviewed: 02/23/2020 Elsevier Patient Education  2021 Elsevier Inc.   

## 2020-11-02 NOTE — Progress Notes (Signed)
Cardiology Consultation:    Date:  11/02/2020   ID:  Debra Harrison, DOB 11-15-1940, MRN [id number]  PCP:  Casper Harrison Debra Coup, MD  Cardiologist:  Debra Balsam, MD   Referring MD: Debra Harrison, Debra Harrison, *   Chief Complaint  Patient presents with  . Sinus Arrhythmia   . Suspected 2 deree heart block   . Supraventricula Tachycardia    History of Present Illness:    Debra Harrison is a 80 y.o. female who is being seen today for the evaluation of dizzy spells at the request of Debra Harrison, Debra Harrison, *.  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  She was referred to [place] because of episode of dizziness.  She can feel her heart speeding up she does have some watch that allowed her to record heart rate and sometimes it alarms her about heart rate of 170.  EKG done by primary care physician so some grouped beating as well as some pauses but none of this is critical enough to act.  She also described a situation when she got palpitation she would be short of breath a little bit also feel uneasy sensation in the chest.  She does have chronic knee problem she required surgery and doing quite well from that point review but walking around bring some tiredness and fatigue.   She does not exercise on the regular basis, She does have multiple family members with rhythm issues few got pacemaker for that defibrillator.  Also history of premature coronary artery disease.  Past Medical History:  Diagnosis Date  . Abnormal electrocardiogram (ECG) (EKG) 09/18/2017  . Acid reflux   . Adult-onset obesity 09/09/2017  . Ankle edema 09/09/2017  . APC (atrial premature contractions) 09/18/2017  . Benign essential hypertension 09/09/2017  . Benign paroxysmal vertigo 09/09/2017  . Bilateral carotid bruits 08/28/2016  . Borderline hyperglycemia 09/09/2017  . Cervical  radiculopathy 08/28/2016  . Chest pain 09/09/2017  . Chronic renal disease, stage III (HCC) 09/09/2017  . Degenerative arthritis of hip   . Dyslipidemia 09/09/2017  . Dysphagia 09/09/2017  . GERD (gastroesophageal reflux disease) 09/09/2017  . Hypercholesterolemia   . Hypertension   . Idiopathic peripheral neuropathy 08/28/2016  . 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 Irregular heart rate 09/09/2017  . Localized osteoarthrosis, lower leg 09/09/2017  . Lower limb length difference 03/12/2017  . Osteoarthritis   . Pernicious anemia 09/09/2017  . Second degree heart block 09/09/2017  . Spinal cord mass (HCC) 08/28/2016  . Thyroid nodule 09/11/2016   Noted on carotid dopplers through neurology 08/30/2016  . Total knee replacement status, left   . Vitamin B 12 deficiency 09/09/2017  . Wrist fracture 05/2001    Past Surgical History:  Procedure Laterality Date  . ABDOMINAL HYSTERECTOMY    . KNEE ARTHROSCOPY Right 05/2007  . left total knee arthroscopy  05/30/2016   Dr. Mardene Speak  . TOTAL HIP ARTHROPLASTY Right 02/01/2008    Current Medications: Current Meds  Medication Sig  . cyclobenzaprine (FLEXERIL) 10 MG tablet Take 10 mg by mouth 3 (three) times daily as needed for muscle spasms.  . furosemide (LASIX) 40 MG tablet Take 40 mg by mouth daily as needed for fluid or edema.  . lidocaine (LIDODERM) 5 % Place 1 patch onto the skin daily.  Marland Kitchen omeprazole (PRILOSEC) 40 MG capsule Take 1 capsule by  mouth as needed for indigestion. Hx of bleeding ulcers  . traMADol (ULTRAM) 50 MG tablet Take 50 mg by mouth as needed for pain.     Allergies:   Methocarbamol, Tetracyclines & related, Tetracycline, and Ace inhibitors   Social History   Socioeconomic History  . Marital status: Widowed    Spouse name: Not on file  . Number of children: Not on file  . Years of education: Not on file  . Highest education level: Not on file  Occupational History  . Not on file  Tobacco Use  . Smoking status:  Former Smoker    Years: 15.00    Types: Cigarettes    Quit date: 1987    Years since quitting: 35.3  . Smokeless tobacco: Never Used  Vaping Use  . Vaping Use: Never used  Substance and Sexual Activity  . Alcohol use: Yes    Comment: occasionally, on a less than daily basis  . Drug use: No  . Sexual activity: Not on file  Other Topics Concern  . Not on file  Social History Narrative  . Not on file   Social Determinants of Health   Financial Resource Strain: Not on file  Food Insecurity: Not on file  Transportation Needs: Not on file  Physical Activity: Not on file  Stress: Not on file  Social Connections: Not on file     Family History: The patient's family history includes AAA (abdominal aortic aneurysm) in her brother; Colon cancer in her mother; Congestive Heart Failure in her mother; Diabetes in her father and mother; Heart disease in her brother and father; Heart failure in her father; Hypertension in her father and mother; Irregular heart beat in her brother; Leukemia in her brother; Thyroid disease in an other family member. ROS:   Please see the history of present illness.    All 14 point review of systems negative except as described per history of present illness.  EKGs/Labs/Other Studies Reviewed:    The following studies were reviewed today: EKG done by primary care physician showed normal sinus rhythm, APCs, poor R wave progression anterior precordium,  EKG:  EKG is  ordered today.  The ekg ordered today demonstrates normal sinus rhythm APCs, poor R wave progression anterior precordium  Recent Labs: No results found for requested labs within last 8760 hours.  Recent Lipid Panel No results found for: CHOL, TRIG, HDL, CHOLHDL, VLDL, LDLCALC, LDLDIRECT  Physical Exam:    VS:  BP (!) 160/62 (BP Location: Right Arm, Patient Position: Sitting)   Pulse 60   Ht 5\' 8"  (1.727 m)   Wt 213 lb (96.6 kg)   SpO2 92%   BMI 32.39 kg/m     Wt Readings from Last 3  Encounters:  11/02/20 213 lb (96.6 kg)  12/25/17 205 lb 4 oz (93.1 kg)  09/19/17 207 lb 12.8 oz (94.3 kg)     GEN:  Well nourished, well developed in no acute distress HEENT: Normal NECK: No JVD; No carotid bruits LYMPHATICS: No lymphadenopathy CARDIAC: RRR, no murmurs, no rubs, no gallops RESPIRATORY:  Clear to auscultation without rales, wheezing or rhonchi  ABDOMEN: Soft, non-tender, non-distended MUSCULOSKELETAL:  No edema; No deformity  SKIN: Warm and dry NEUROLOGIC:  Alert and oriented x 3 PSYCHIATRIC:  Normal affect   ASSESSMENT:    1. Total knee replacement status, left   2. Palpitations   3. Dizziness   4. Dyslipidemia   5. Dyspnea on exertion    PLAN:  In order of problems listed above:  1. Episode of dizziness with some palpitations obviously concerning I will put a monitor on her Zio patch AT to make sure were not missing any significant arrhythmia I asked her to press the button if she feels it a palpitation no episode of dizziness, multiple potential explanation for his symptomatology could be related to tachyarrhythmias, could be related to poor sinus node recovery time after abrupt discontinuation of supraventricular tachycardia, there is no way to manage this condition appropriately without knowing clearly with a diagnosis and hopefully Zio patch will give [place] the diagnosis.  As a part of evaluation I will ask her to have an echocardiogram to assess left ventricle ejection fraction. 2. Dyslipidemia that be followed by primary care physician.  We will get copy of her fasting lipid profile.  She told me that her cholesterol was quite good. 3. Dyspnea on exertion I will ask her to have an echocardiogram to assess left ventricle ejection fraction.  In the future we may be forced to do evaluation for coronary artery disease but first we need to take care of the rhythm as well as assess echocardiographically ejection fraction.   Medication Adjustments/Labs and Tests  Ordered: Current medicines are reviewed at length with the patient today.  Concerns regarding medicines are outlined above.  No orders of the defined types were placed in this encounter.  No orders of the defined types were placed in this encounter.   Signed, Georgeanna Lea, MD, [facility]. 11/02/2020 2:27 PM    [facility]

## 2020-11-08 DIAGNOSIS — G90521 Complex regional pain syndrome I of right lower limb: Secondary | ICD-10-CM | POA: Diagnosis not present

## 2020-11-15 ENCOUNTER — Ambulatory Visit (INDEPENDENT_AMBULATORY_CARE_PROVIDER_SITE_OTHER): Payer: Medicare Other

## 2020-11-15 ENCOUNTER — Other Ambulatory Visit: Payer: Self-pay

## 2020-11-15 DIAGNOSIS — R42 Dizziness and giddiness: Secondary | ICD-10-CM

## 2020-11-15 DIAGNOSIS — R0600 Dyspnea, unspecified: Secondary | ICD-10-CM

## 2020-11-15 DIAGNOSIS — R0609 Other forms of dyspnea: Secondary | ICD-10-CM

## 2020-11-15 DIAGNOSIS — R002 Palpitations: Secondary | ICD-10-CM

## 2020-11-15 LAB — ECHOCARDIOGRAM COMPLETE
AR max vel: 2.6 cm2
AV Area VTI: 2.63 cm2
AV Area mean vel: 2.56 cm2
AV Mean grad: 9 mmHg
AV Peak grad: 14.9 mmHg
Ao pk vel: 1.93 m/s
Area-P 1/2: 3.2 cm2
P 1/2 time: 527 msec
S' Lateral: 3.4 cm

## 2020-11-15 NOTE — Progress Notes (Signed)
Complete echocardiogram performed.  Jimmy Deanndra Kirley RDCS, RVT  

## 2020-11-18 ENCOUNTER — Telehealth: Payer: Self-pay | Admitting: Cardiology

## 2020-11-18 NOTE — Telephone Encounter (Signed)
Did not need this encounter °

## 2020-11-23 DIAGNOSIS — E538 Deficiency of other specified B group vitamins: Secondary | ICD-10-CM | POA: Diagnosis not present

## 2020-11-29 DIAGNOSIS — T8484XS Pain due to internal orthopedic prosthetic devices, implants and grafts, sequela: Secondary | ICD-10-CM | POA: Diagnosis not present

## 2020-11-29 DIAGNOSIS — G90529 Complex regional pain syndrome I of unspecified lower limb: Secondary | ICD-10-CM | POA: Diagnosis not present

## 2020-11-29 DIAGNOSIS — G8929 Other chronic pain: Secondary | ICD-10-CM | POA: Diagnosis not present

## 2020-12-02 ENCOUNTER — Telehealth: Payer: Self-pay | Admitting: Emergency Medicine

## 2020-12-02 ENCOUNTER — Telehealth: Payer: Self-pay | Admitting: Cardiology

## 2020-12-02 MED ORDER — METOPROLOL SUCCINATE ER 50 MG PO TB24: 50.0000 mg | ORAL_TABLET | Freq: Every day | ORAL | 1 refills | Status: DC

## 2020-12-02 MED ORDER — METOPROLOL SUCCINATE ER 50 MG PO TB24: 50.0000 mg | ORAL_TABLET | Freq: Every day | ORAL | 0 refills | Status: DC

## 2020-12-02 NOTE — Telephone Encounter (Signed)
Called patient informed her of results. Sent in RX. No further questions.

## 2020-12-02 NOTE — Telephone Encounter (Signed)
Called patient back informed her of results see additional encounter.

## 2020-12-02 NOTE — Telephone Encounter (Signed)
New message ° ° ° ° ° °Returning a call to the nurse to get monitor results °

## 2020-12-02 NOTE — Telephone Encounter (Signed)
-----   Message from Georgeanna Lea, MD sent at 12/01/2020  8:24 PM EDT ----- Monitor showed multiple episode of supraventricular tachycardia.  Please start metoprolol succinate 50 mg daily

## 2020-12-07 ENCOUNTER — Encounter: Payer: Self-pay | Admitting: Physical Medicine & Rehabilitation

## 2020-12-27 DIAGNOSIS — E538 Deficiency of other specified B group vitamins: Secondary | ICD-10-CM | POA: Diagnosis not present

## 2021-01-01 ENCOUNTER — Other Ambulatory Visit: Payer: Self-pay | Admitting: Cardiology

## 2021-01-13 ENCOUNTER — Encounter: Payer: Self-pay | Admitting: Physical Medicine & Rehabilitation

## 2021-01-13 ENCOUNTER — Encounter: Payer: Medicare Other | Attending: Physical Medicine & Rehabilitation | Admitting: Physical Medicine & Rehabilitation

## 2021-01-13 ENCOUNTER — Other Ambulatory Visit: Payer: Self-pay

## 2021-01-13 VITALS — BP 136/84 | HR 75 | Temp 98.3°F | Ht 68.0 in | Wt 211.8 lb

## 2021-01-13 DIAGNOSIS — G8929 Other chronic pain: Secondary | ICD-10-CM | POA: Diagnosis not present

## 2021-01-13 DIAGNOSIS — M792 Neuralgia and neuritis, unspecified: Secondary | ICD-10-CM

## 2021-01-13 DIAGNOSIS — G8928 Other chronic postprocedural pain: Secondary | ICD-10-CM | POA: Diagnosis not present

## 2021-01-13 NOTE — Patient Instructions (Signed)
Genicular nerve block for Left knee pain

## 2021-01-13 NOTE — Progress Notes (Signed)
 Subjective:    Patient ID: Debra Harrison, female    DOB: 02/23/1941, 79 y.o.   MRN: [id number]  HPI CC: Left knee as well as bilateral foot and leg pain  Left knee pain mainly with weightbearing but also has tenderness to touch over the left knee.  She has had no recent falls or trauma.  She underwent a left knee replacement in 2017 and states her left lower extremity has been about an inch longer than the right since that time.  She does have a built-up shoe on the right that she does not wear very often. Feet feel like they are in a hot "bucket of coals"  .  Tingling in feet when touched  Pt states onset was in November of 2017 , started during preop spinal epidural prior to left TKR.  .Also has had leg length discrepency since her TKR Post op PT but none since  Seen by Ortho second opinion , Dr Alusio , no issue with TKR  [name] has tried lumbar injections which do help "to a degree"  Pt has tried multiple medications but stopped "all of them" due to problem with thinking   Ambulates without AD in home but uses cane outside the home  Pain Inventory Average Pain  has nerve type pain and not defined Pain Right Now has nerve type pain and not defined My pain is constant, sharp, burning, stabbing, and tingling  In the last 24 hours, has pain interfered with the following? General activity 0 Relation with others 0 Enjoyment of life 0 What TIME of day is your pain at its worst? varies Sleep (in general) Poor  Pain is worse with: some activites Pain improves with: injections Relief from Meds: 3  use a cane how many minutes can you walk? 2500 steps per day ability to climb steps?  yes do you drive?  yes  retired Do you have any goals in this area?  yes  bladder control problems numbness tingling trouble walking  Any changes since last visit?  no  Orthopedist Dr Ramols/   Dr Hubble in  [place] did her knee surgery    Family History  Problem Relation Age of Onset  .  Congestive Heart Failure Mother   . Colon cancer Mother   . Diabetes Mother   . Hypertension Mother   . Heart failure Father   . Diabetes Father   . Hypertension Father   . Heart disease Father   . AAA (abdominal aortic aneurysm) Brother   . Thyroid disease Other   . Leukemia Brother   . Irregular heart beat Brother   . Heart disease Brother    Social History   Socioeconomic History  . Marital status: Widowed    Spouse name: Not on file  . Number of children: Not on file  . Years of education: Not on file  . Highest education level: Not on file  Occupational History  . Not on file  Tobacco Use  . Smoking status: Former    Years: 15.00    Pack years: 0.00    Types: Cigarettes    Quit date: 1987    Years since quitting: 35.5  . Smokeless tobacco: Never  Vaping Use  . Vaping Use: Never used  Substance and Sexual Activity  . Alcohol use: Yes    Comment: occasionally, on a less than daily basis  . Drug use: No  . Sexual activity: Not on file  Other Topics Concern  . Not   on file  Social History Narrative  . Not on file   Social Determinants of Health   Financial Resource Strain: Not on file  Food Insecurity: Not on file  Transportation Needs: Not on file  Physical Activity: Not on file  Stress: Not on file  Social Connections: Not on file   Past Surgical History:  Procedure Laterality Date  . ABDOMINAL HYSTERECTOMY    . KNEE ARTHROSCOPY Right 05/2007  . left total knee arthroscopy  05/30/2016   Dr. Hubler  . TOTAL HIP ARTHROPLASTY Right 02/01/2008   Past Medical History:  Diagnosis Date  . Abnormal electrocardiogram (ECG) (EKG) 09/18/2017  . Acid reflux   . Adult-onset obesity 09/09/2017  . Ankle edema 09/09/2017  . APC (atrial premature contractions) 09/18/2017  . Benign essential hypertension 09/09/2017  . Benign paroxysmal vertigo 09/09/2017  . Bilateral carotid bruits 08/28/2016  . Borderline hyperglycemia 09/09/2017  . Cervical radiculopathy 08/28/2016  .  Chest pain 09/09/2017  . Chronic renal disease, stage III (HCC) 09/09/2017  . Degenerative arthritis of hip   . Dyslipidemia 09/09/2017  . Dysphagia 09/09/2017  . GERD (gastroesophageal reflux disease) 09/09/2017  . Hypercholesterolemia   . Hypertension   . Idiopathic peripheral neuropathy 08/28/2016  . 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 Irregular heart rate 09/09/2017  . Localized osteoarthrosis, lower leg 09/09/2017  . Lower limb length difference 03/12/2017  . Osteoarthritis   . Pernicious anemia 09/09/2017  . Second degree heart block 09/09/2017  . Spinal cord mass (HCC) 08/28/2016  . Thyroid nodule 09/11/2016   Noted on carotid dopplers through neurology 08/30/2016  . Total knee replacement status, left   . Vitamin B 12 deficiency 09/09/2017  . Wrist fracture 05/2001   BP 136/84   Pulse 75   Temp 98.3 F (36.8 C)   Ht 5' 8" (1.727 m)   Wt 211 lb 12.8 oz (96.1 kg)   SpO2 94%   BMI 32.20 kg/m   Opioid Risk Score:   Fall Risk Score:  `1  Depression screen PHQ 2/9  Depression screen PHQ 2/9 01/13/2021  Decreased Interest 0  Down, Depressed, Hopeless 0  PHQ - 2 Score 0  Altered sleeping 0  Tired, decreased energy 2  Change in appetite 0  Feeling bad or failure about yourself  0  Trouble concentrating 0  Moving slowly or fidgety/restless 0  Suicidal thoughts 0  PHQ-9 Score 2     Review of Systems  Constitutional: Negative.   HENT: Negative.    Eyes: Negative.   Respiratory:  Positive for shortness of breath.   Cardiovascular:  Positive for leg swelling.  Gastrointestinal: Negative.   Endocrine: Negative.   Genitourinary:        Bladder control  Musculoskeletal:  Positive for gait problem.  Skin: Negative.   Allergic/Immunologic: Negative.   Neurological:  Positive for numbness.       Tingling in legs  Hematological: Negative.   Psychiatric/Behavioral: Negative.    All other systems reviewed and are negative.     Objective:   Physical Exam Vitals and  nursing note reviewed.  Constitutional:      Appearance: She is obese.  HENT:     Head: Normocephalic and atraumatic.  Eyes:     Extraocular Movements: Extraocular movements intact.     Conjunctiva/sclera: Conjunctivae normal.     Pupils: Pupils are equal, round, and reactive to light.  Cardiovascular:     Rate and Rhythm: Normal rate and regular rhythm.       Pulses: Normal pulses.     Heart sounds: Normal heart sounds. No murmur heard. Abdominal:     General: Abdomen is flat. Bowel sounds are normal. There is no distension.     Palpations: Abdomen is soft. There is no mass.  Musculoskeletal:        General: No swelling or tenderness.     Cervical back: Normal range of motion. No tenderness.     Comments: Left knee no evidence of effusion there is a healed surgical incision midline hypersensitivity to touch along the incision pain with even light palpation along the medial and lateral joint lines more so than around the quadricep or patellar tendon.  She has full extension she has flexion past 100 degrees. Right knee has no tenderness to palpation.  No evidence of effusion she has full range of motion. Lower extremity strength is 5/5 in the right hip flexor knee extensor ankle dorsiflexor 4/5 in left hip flexor knee extensor 5/5 in left ankle dorsiflexor Sensation intact light touch in lower limbs Lumbar spine without pain to palpation.  She has mildly reduced lumbar range of motion with flexion extension. Ambulates with an antalgic gait no evidence of toe drag or knee instability.  Neurological:     Mental Status: She is alert and oriented to person, place, and time.     Sensory: Sensation is intact.  Psychiatric:        Mood and Affect: Mood normal.        Behavior: Behavior normal.          Assessment & Plan:   1.  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  She has no vasomotor symptoms to suggest type I CRPS  We discussed her failure of  conservative care including physical therapy as well as medication management as well as lumbar injections to help with her knee pain. We discussed trial of genicular nerve blocks and if helpful in relieving left knee pain proceed to radiofrequency ablation of the same nerves.  2.  Bilateral lower extremity paresthesias following spinal anesthetic.  Likely nerve root injuries.  She does not like the way neuropathic pain medications make her feel.  Pain does interfere with normal activities.  We discussed that acupuncture may be helpful for this although its not guaranteed to alleviate symptoms.  She would like to work on her left knee pain prior to a trial of acupuncture 

## 2021-01-13 NOTE — Progress Notes (Signed)
Subjective:    Patient ID: Debra Harrison, female    DOB: Jan 30, 1941, 80 y.o.   MRN: [id number]  HPI CC: Left knee as well as bilateral foot and leg pain  Left knee pain mainly with weightbearing but also has tenderness to touch over the left knee.  She has had no recent falls or trauma.  She underwent a left knee replacement in 2017 and states her left lower extremity has been about an inch longer than the right since that time.  She does have a built-up shoe on the right that she does not wear very often. Feet feel like they are in a hot "bucket of coals"  .  Tingling in feet when touched  Pt states onset was in November of 2017 , started during preop spinal epidural prior to left TKR.  Marland KitchenAlso has had leg length discrepency since her TKR Post op PT but none since  Seen by Ortho second opinion , Debra Harrison , no issue with TKR  Debra Harrison has tried lumbar injections which do help "to a degree"  Pt has tried multiple medications but stopped "all of them" due to problem with thinking   Ambulates without AD in home but uses cane outside the home  Pain Inventory Average Pain  has nerve type pain and not defined Pain Right Now has nerve type pain and not defined My pain is constant, sharp, burning, stabbing, and tingling  In the last 24 hours, has pain interfered with the following? General activity 0 Relation with others 0 Enjoyment of life 0 What TIME of day is your pain at its worst? varies Sleep (in general) Poor  Pain is worse with: some activites Pain improves with: injections Relief from Meds: 3  use a cane how many minutes can you walk? 2500 steps per day ability to climb steps?  yes do you drive?  yes  retired Do you have any goals in this area?  yes  bladder control problems numbness tingling trouble walking  Any changes since last visit?  no  Orthopedist Debra Harrison/   Debra Harrison in  [place] did her knee surgery    Family History  Problem Relation Age of Onset  .  Congestive Heart Failure Mother   . Colon cancer Mother   . Diabetes Mother   . Hypertension Mother   . Heart failure Father   . Diabetes Father   . Hypertension Father   . Heart disease Father   . AAA (abdominal aortic aneurysm) Brother   . Thyroid disease Other   . Leukemia Brother   . Irregular heart beat Brother   . Heart disease Brother    Social History   Socioeconomic History  . Marital status: Widowed    Spouse name: Not on file  . Number of children: Not on file  . Years of education: Not on file  . Highest education level: Not on file  Occupational History  . Not on file  Tobacco Use  . Smoking status: Former    Years: 15.00    Pack years: 0.00    Types: Cigarettes    Quit date: 1987    Years since quitting: 35.5  . Smokeless tobacco: Never  Vaping Use  . Vaping Use: Never used  Substance and Sexual Activity  . Alcohol use: Yes    Comment: occasionally, on a less than daily basis  . Drug use: No  . Sexual activity: Not on file  Other Topics Concern  . Not  on file  Social History Narrative  . Not on file   Social Determinants of Health   Financial Resource Strain: Not on file  Food Insecurity: Not on file  Transportation Needs: Not on file  Physical Activity: Not on file  Stress: Not on file  Social Connections: Not on file   Past Surgical History:  Procedure Laterality Date  . ABDOMINAL HYSTERECTOMY    . KNEE ARTHROSCOPY Right 05/2007  . left total knee arthroscopy  05/30/2016   Debra. Mardene Harrison  . TOTAL HIP ARTHROPLASTY Right 02/01/2008   Past Medical History:  Diagnosis Date  . Abnormal electrocardiogram (ECG) (EKG) 09/18/2017  . Acid reflux   . Adult-onset obesity 09/09/2017  . Ankle edema 09/09/2017  . APC (atrial premature contractions) 09/18/2017  . Benign essential hypertension 09/09/2017  . Benign paroxysmal vertigo 09/09/2017  . Bilateral carotid bruits 08/28/2016  . Borderline hyperglycemia 09/09/2017  . Cervical radiculopathy 08/28/2016  .  Chest pain 09/09/2017  . Chronic renal disease, stage III (HCC) 09/09/2017  . Degenerative arthritis of hip   . Dyslipidemia 09/09/2017  . Dysphagia 09/09/2017  . GERD (gastroesophageal reflux disease) 09/09/2017  . Hypercholesterolemia   . Hypertension   . Idiopathic peripheral neuropathy 08/28/2016  . 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 Irregular heart rate 09/09/2017  . Localized osteoarthrosis, lower leg 09/09/2017  . Lower limb length difference 03/12/2017  . Osteoarthritis   . Pernicious anemia 09/09/2017  . Second degree heart block 09/09/2017  . Spinal cord mass (HCC) 08/28/2016  . Thyroid nodule 09/11/2016   Noted on carotid dopplers through neurology 08/30/2016  . Total knee replacement status, left   . Vitamin B 12 deficiency 09/09/2017  . Wrist fracture 05/2001   BP 136/84   Pulse 75   Temp 98.3 F (36.8 C)   Ht 5\' 8"  (1.727 m)   Wt 211 lb 12.8 oz (96.1 kg)   SpO2 94%   BMI 32.20 kg/m   Opioid Risk Score:   Fall Risk Score:  `1  Depression screen PHQ 2/9  Depression screen PHQ 2/9 01/13/2021  Decreased Interest 0  Down, Depressed, Hopeless 0  PHQ - 2 Score 0  Altered sleeping 0  Tired, decreased energy 2  Change in appetite 0  Feeling bad or failure about yourself  0  Trouble concentrating 0  Moving slowly or fidgety/restless 0  Suicidal thoughts 0  PHQ-9 Score 2     Review of Systems  Constitutional: Negative.   HENT: Negative.    Eyes: Negative.   Respiratory:  Positive for shortness of breath.   Cardiovascular:  Positive for leg swelling.  Gastrointestinal: Negative.   Endocrine: Negative.   Genitourinary:        Bladder control  Musculoskeletal:  Positive for gait problem.  Skin: Negative.   Allergic/Immunologic: Negative.   Neurological:  Positive for numbness.       Tingling in legs  Hematological: Negative.   Psychiatric/Behavioral: Negative.    All other systems reviewed and are negative.     Objective:   Physical Exam Vitals and  nursing note reviewed.  Constitutional:      Appearance: She is obese.  HENT:     Head: Normocephalic and atraumatic.  Eyes:     Extraocular Movements: Extraocular movements intact.     Conjunctiva/sclera: Conjunctivae normal.     Pupils: Pupils are equal, round, and reactive to light.  Cardiovascular:     Rate and Rhythm: Normal rate and regular rhythm.  Pulses: Normal pulses.     Heart sounds: Normal heart sounds. No murmur heard. Abdominal:     General: Abdomen is flat. Bowel sounds are normal. There is no distension.     Palpations: Abdomen is soft. There is no mass.  Musculoskeletal:        General: No swelling or tenderness.     Cervical back: Normal range of motion. No tenderness.     Comments: Left knee no evidence of effusion there is a healed surgical incision midline hypersensitivity to touch along the incision pain with even light palpation along the medial and lateral joint lines more so than around the quadricep or patellar tendon.  She has full extension she has flexion past 100 degrees. Right knee has no tenderness to palpation.  No evidence of effusion she has full range of motion. Lower extremity strength is 5/5 in the right hip flexor knee extensor ankle dorsiflexor 4/5 in left hip flexor knee extensor 5/5 in left ankle dorsiflexor Sensation intact light touch in lower limbs Lumbar spine without pain to palpation.  She has mildly reduced lumbar range of motion with flexion extension. Ambulates with an antalgic gait no evidence of toe drag or knee instability.  Neurological:     Mental Status: She is alert and oriented to person, place, and time.     Sensory: Sensation is intact.  Psychiatric:        Mood and Affect: Mood normal.        Behavior: Behavior normal.          Assessment & Plan:   1.  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  She has no vasomotor symptoms to suggest type I CRPS  We discussed her failure of  conservative care including physical therapy as well as medication management as well as lumbar injections to help with her knee pain. We discussed trial of genicular nerve blocks and if helpful in relieving left knee pain proceed to radiofrequency ablation of the same nerves.  2.  Bilateral lower extremity paresthesias following spinal anesthetic.  Likely nerve root injuries.  She does not like the way neuropathic pain medications make her feel.  Pain does interfere with normal activities.  We discussed that acupuncture may be helpful for this although its not guaranteed to alleviate symptoms.  She would like to work on her left knee pain prior to a trial of acupuncture

## 2021-01-19 ENCOUNTER — Encounter: Payer: Self-pay | Admitting: Cardiology

## 2021-01-19 ENCOUNTER — Other Ambulatory Visit: Payer: Self-pay

## 2021-01-19 ENCOUNTER — Ambulatory Visit (INDEPENDENT_AMBULATORY_CARE_PROVIDER_SITE_OTHER): Payer: Medicare Other | Admitting: Cardiology

## 2021-01-19 VITALS — BP 138/78 | HR 48 | Ht 68.0 in | Wt 206.6 lb

## 2021-01-19 DIAGNOSIS — I471 Supraventricular tachycardia: Secondary | ICD-10-CM

## 2021-01-19 DIAGNOSIS — E7800 Pure hypercholesterolemia, unspecified: Secondary | ICD-10-CM

## 2021-01-19 DIAGNOSIS — I712 Thoracic aortic aneurysm, without rupture, unspecified: Secondary | ICD-10-CM

## 2021-01-19 DIAGNOSIS — E785 Hyperlipidemia, unspecified: Secondary | ICD-10-CM | POA: Diagnosis not present

## 2021-01-19 DIAGNOSIS — R0600 Dyspnea, unspecified: Secondary | ICD-10-CM

## 2021-01-19 DIAGNOSIS — I10 Essential (primary) hypertension: Secondary | ICD-10-CM

## 2021-01-19 DIAGNOSIS — R0609 Other forms of dyspnea: Secondary | ICD-10-CM

## 2021-01-19 DIAGNOSIS — I7121 Aneurysm of the ascending aorta, without rupture: Secondary | ICD-10-CM | POA: Insufficient documentation

## 2021-01-19 MED ORDER — METOPROLOL SUCCINATE ER 25 MG PO TB24: 25.0000 mg | ORAL_TABLET | Freq: Every day | ORAL | 1 refills | Status: DC

## 2021-01-19 NOTE — Patient Instructions (Signed)
Medication Instructions:  Your physician has recommended you make the following change in your medication: Metoprolol Succinate 25mg  once daily *If you need a refill on your cardiac medications before your next appointment, please call your pharmacy*   Lab Work: Your physician recommends that you return for lab work in: [facility] If you have labs (blood work) drawn today and your tests are completely normal, you will receive your results only by: MyChart Message (if you have MyChart) OR A paper copy in the mail If you have any lab test that is abnormal or we need to change your treatment, we will call you to review the results.   Testing/Procedures: CT-scan of the chest  Your physician has requested that you have an abdominal aorta duplex. During this test, an ultrasound is used to evaluate the aorta. Allow 30 minutes for this exam. Do not eat after midnight the day before and avoid carbonated beverages    Follow-Up: At [facility], you and your health needs are our priority.  As part of our continuing mission to provide you with exceptional heart care, we have created designated Provider Care Teams.  These Care Teams include your primary Cardiologist (physician) and Advanced Practice Providers (APPs -  Physician Assistants and Nurse Practitioners) who all work together to provide you with the care you need, when you need it.  We recommend signing up for the patient portal called "MyChart".  Sign up information is provided on this After Visit Summary.  MyChart is used to connect with patients for Virtual Visits (Telemedicine).  Patients are able to view lab/test results, encounter notes, upcoming appointments, etc.  Non-urgent messages can be sent to your provider as well.   To learn more about what you can do with MyChart, go to [facility].    Your next appointment:   3 month(s)  The format for your next appointment:   In Person  Provider:   [email or web address], MD   Other  Instructions '

## 2021-01-19 NOTE — Progress Notes (Signed)
Cardiology Office Note:    Date:  01/19/2021   ID:  Debra Harrison, DOB 06-13-1941, MRN [id number]  PCP:  Street, [name], MD  Cardiologist:  Gypsy Balsam, MD    Referring MD: Street, [name], *   Chief Complaint  Patient presents with   Follow-up  I am doing better  History of Present Illness:    Debra Harrison is a 80 y.o. female who with past medical history significant for palpitations, chronic kidney failure, B12 deficiency, status post left knee replacement she was referred to [place] originally because of dizzy spells.  I put monitor on her monitor shows frequent episode of supraventricular tachycardia, I put her on small dose of beta-blocker metoprolol 50 mg and she is doing much better.  She complains however being sleepy and today she seems to be bradycardic.  No dizziness no passing out.  She did also have echocardiogram which showed enlargement of her ascending aorta measuring 42 mm.  Interestingly she does have multiple family members total of 4 family members with aneurysms.  Clearly there is some family are predisposition for this and need to be investigated.  Denies have any chest pain tightness squeezing pressure burning chest trying to do better with her walking after knee replacement.  Past Medical History:  Diagnosis Date   Abnormal electrocardiogram (ECG) (EKG) 09/18/2017   Acid reflux    Adult-onset obesity 09/09/2017   Ankle edema 09/09/2017   APC (atrial premature contractions) 09/18/2017   Benign essential hypertension 09/09/2017   Benign paroxysmal vertigo 09/09/2017   Bilateral carotid bruits 08/28/2016   Borderline hyperglycemia 09/09/2017   Cervical radiculopathy 08/28/2016   Chest pain 09/09/2017   Chronic renal disease, stage III (HCC) 09/09/2017   Degenerative arthritis of hip    Dyslipidemia 09/09/2017   Dysphagia 09/09/2017   GERD (gastroesophageal reflux disease) 09/09/2017   Hypercholesterolemia    Hypertension    Idiopathic peripheral neuropathy  08/28/2016   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 Mardene Speak   TOTAL HIP ARTHROPLASTY Right 02/01/2008    Current Medications: Current Meds  Medication Sig   losartan (COZAAR) 100 MG tablet Take 100 mg by mouth daily.   metoprolol succinate (TOPROL-XL) 50 MG 24 hr tablet TAKE 1 TABLET(50 MG) BY MOUTH DAILY WITH OR IMMEDIATELY FOLLOWING A MEAL (Patient taking differently: Take 50 mg by mouth daily.)   omeprazole (PRILOSEC) 40 MG capsule Take 1 capsule by mouth as needed for indigestion. Hx of bleeding ulcers   traMADol (ULTRAM) 50 MG tablet Take 50 mg by mouth as needed for pain.     Allergies:   Methocarbamol, Tetracyclines & related, Tetracycline, and Ace inhibitors   Social History   Socioeconomic History   Marital status: Widowed    Spouse name: Not on file   Number of children: Not on file   Years of education: Not  on file   Highest education level: Not on file  Occupational History   Not on file  Tobacco Use   Smoking status: Former    Years: 15.00    Pack years: 0.00    Types: Cigarettes    Quit date: 65    Years since quitting: 35.5   Smokeless tobacco: Never  Vaping Use   Vaping Use: Never used  Substance and Sexual Activity   Alcohol use: Yes    Comment: occasionally, on a less than daily basis   Drug use: No   Sexual activity: Not on file  Other Topics Concern   Not  on file  Social History Narrative   Not on file   Social Determinants of Health   Financial Resource Strain: Not on file  Food Insecurity: Not on file  Transportation Needs: Not on file  Physical Activity: Not on file  Stress: Not on file  Social Connections: Not on file     Family History: The patient's family history includes AAA (abdominal aortic aneurysm) in her brother; Colon cancer in her mother; Congestive Heart Failure in her mother; Diabetes in her father and mother; Heart disease in her brother and father; Heart failure in her father; Hypertension in her father and mother; Irregular heart beat in her brother; Leukemia in her brother; Thyroid disease in an other family member. ROS:   Please see the history of present illness.    All 14 point review of systems negative except as described per history of present illness  EKGs/Labs/Other Studies Reviewed:      Recent Labs: No results found for requested labs within last 8760 hours.  Recent Lipid Panel No results found for: CHOL, TRIG, HDL, CHOLHDL, VLDL, LDLCALC, LDLDIRECT  Physical Exam:    VS:  BP 138/78 (BP Location: Left Arm, Patient Position: Sitting)   Pulse (!) 48   Ht 5\' 8"  (1.727 m)   Wt 206 lb 9.6 oz (93.7 kg)   SpO2 96%   BMI 31.41 kg/m     Wt Readings from Last 3 Encounters:  01/19/21 206 lb 9.6 oz (93.7 kg)  01/13/21 211 lb 12.8 oz (96.1 kg)  11/02/20 213 lb (96.6 kg)     GEN:  Well nourished, well developed in no acute distress HEENT: Normal NECK: No JVD; No carotid bruits LYMPHATICS: No lymphadenopathy CARDIAC: RRR, no murmurs, no rubs, no gallops RESPIRATORY:  Clear to auscultation without rales, wheezing or rhonchi  ABDOMEN: Soft, non-tender, non-distended MUSCULOSKELETAL:  No edema; No deformity  SKIN: Warm and dry LOWER EXTREMITIES: no swelling NEUROLOGIC:  Alert and oriented x 3 PSYCHIATRIC:  Normal affect   ASSESSMENT:    1. Primary hypertension   2. Supraventricular tachycardia  (HCC)   3. Dyspnea on exertion   4. Hypercholesterolemia   5. Dyslipidemia   6. Ascending aortic aneurysm (HCC) 42 mm by echo    PLAN:    In order of problems listed above:  Supraventricular tachycardia, will reduce dose of metoprolol to only 25, I will ask her to have EKG done today. Ascending aortic aneurysm: We will schedule her to have CT angio of the chest with contrast to look at the entire aorta in a chest, will also do ultrasounds of her abdomen to rule out significant aneurysm in different locations.  Of course the biggest concern is the fact that she has multiple family members with aneurysm. Essential hypertension seems to be well controlled continue present management. Dyspnea on exertion: Echocardiogram preserved ejection fraction no  significant valvular pathology,   Medication Adjustments/Labs and Tests Ordered: Current medicines are reviewed at length with the patient today.  Concerns regarding medicines are outlined above.  No orders of the defined types were placed in this encounter.  Medication changes: No orders of the defined types were placed in this encounter.   Signed, Georgeanna Lea, MD, [facility] 01/19/2021 1:45 PM    [facility]

## 2021-01-20 ENCOUNTER — Encounter: Payer: Self-pay | Admitting: Cardiology

## 2021-01-20 ENCOUNTER — Telehealth: Payer: Self-pay

## 2021-01-20 LAB — BASIC METABOLIC PANEL
BUN/Creatinine Ratio: 16 (ref 12–28)
BUN: 13 mg/dL (ref 8–27)
CO2: 27 mmol/L (ref 20–29)
Calcium: 10.2 mg/dL (ref 8.7–10.3)
Chloride: 99 mmol/L (ref 96–106)
Creatinine, Ser: 0.8 mg/dL (ref 0.57–1.00)
Glucose: 96 mg/dL (ref 65–99)
Potassium: 4.7 mmol/L (ref 3.5–5.2)
Sodium: 141 mmol/L (ref 134–144)
eGFR: 74 mL/min/{1.73_m2} (ref >59)

## 2021-01-20 NOTE — Telephone Encounter (Signed)
Spoke with patient regarding results and recommendation.  Patient verbalizes understanding and is agreeable to plan of care. Advised patient to call back with any issues or concerns.  

## 2021-01-20 NOTE — Telephone Encounter (Signed)
-----   Message from Georgeanna Lea, MD sent at 01/20/2021 12:38 PM EDT ----- Complete metabolic panel looks good, proceed with plans

## 2021-01-23 DIAGNOSIS — I712 Thoracic aortic aneurysm, without rupture: Secondary | ICD-10-CM | POA: Diagnosis not present

## 2021-01-23 DIAGNOSIS — K449 Diaphragmatic hernia without obstruction or gangrene: Secondary | ICD-10-CM | POA: Diagnosis not present

## 2021-01-31 DIAGNOSIS — D510 Vitamin B12 deficiency anemia due to intrinsic factor deficiency: Secondary | ICD-10-CM | POA: Diagnosis not present

## 2021-01-31 DIAGNOSIS — Z23 Encounter for immunization: Secondary | ICD-10-CM | POA: Diagnosis not present

## 2021-02-07 ENCOUNTER — Other Ambulatory Visit: Payer: Medicare Other

## 2021-02-13 ENCOUNTER — Other Ambulatory Visit: Payer: Self-pay

## 2021-02-13 MED ORDER — METOPROLOL SUCCINATE ER 25 MG PO TB24: 25.0000 mg | ORAL_TABLET | Freq: Every day | ORAL | 3 refills | Status: DC

## 2021-02-17 ENCOUNTER — Encounter: Payer: Medicare Other | Attending: Physical Medicine & Rehabilitation | Admitting: Physical Medicine & Rehabilitation

## 2021-02-17 ENCOUNTER — Encounter: Payer: Self-pay | Admitting: Physical Medicine & Rehabilitation

## 2021-02-17 ENCOUNTER — Other Ambulatory Visit: Payer: Self-pay

## 2021-02-17 VITALS — BP 176/85 | HR 71 | Temp 98.0°F | Ht 68.0 in | Wt 208.6 lb

## 2021-02-17 DIAGNOSIS — G8928 Other chronic postprocedural pain: Secondary | ICD-10-CM | POA: Diagnosis not present

## 2021-02-17 NOTE — Patient Instructions (Signed)
Genicular nerve blocks were performed today. This is to block pain signals from the knee. A local anesthetic was used to block the knee therefore this will not be a permanent procedure. Please keep track of your pain today and compared to the pain that you had prior to the injection. At next visit we will discuss the results. If it is helpful but only short-term we would need to confirm this with an additional injection prior to proceeding with a radiofrequency neurotomy  °

## 2021-02-17 NOTE — Progress Notes (Signed)
  PROCEDURE RECORD [facility]   Name: Debra Harrison DOB:[birth year] MRN: [id number]  Date:02/17/2021  Physician: Claudette Laws, MD    Nurse/CMA: Wessling CMA  Allergies:  Allergies  Allergen Reactions   Methocarbamol Hives    ask    Tetracyclines & Related Hives   Tetracycline Hives   Ace Inhibitors Cough    Consent Signed: Yes.    Is patient diabetic? No.  CBG today?   Pregnant: No. LMP: No LMP recorded. Patient has had a hysterectomy. (age 26-55)  Anticoagulants: no Anti-inflammatory: no Antibiotics: no  Procedure: Left genicular nerve block  Position: Supine Start Time: 2:35pm  End Time: 2:45pm  Fluoro Time:36s   RN/CMA Scientist, forensic, CMA    Time 1:35pm 2:50pm    BP 176/85 199/79    Pulse 71 72    Respirations 14 14    O2 Sat 95 96    S/S 6 6    Pain Level 7/10 0/10     Patient A & O X 3, D/C instructions reviewed, and sits independently.

## 2021-02-17 NOTE — Progress Notes (Signed)
Left genicular nerve blocks under fluoroscopic guidance  Indication Chronic severe post operative knee pain that has not responded to PT, medication, and other conservative care.  Informed consent was obtained after discussing risks and benefits of procedure with the patient.  These include bleeding, bruising and infection as well as foot numbness. Pt placed in supine position on the fluoro table .  Static images identified distal femur and proximal tibia.  Lateral and medial supracondylar , as well as medial tibial flare prepped with betadine.  Marked under fluoro and then a 25 g 1.5 inch needle was used to anesthetize skin and subcutaneous tissue. 1.5 cc was infiltrate into each of 3 sites. Then a 22-gauge 3.5 inch needle was inserted under fluoroscopic guidance first targeting the medial tibial flare midpoint. After bone contact was made lateral images confirmed proper positioning and Isovue 200 times one ML was injected. This showed no evidence of intravascular uptake. Then 1.5 ML of the solution containing one ML of Celestone 6 mg per mL with 4 ML of .25%marcaincaine was injected. This same procedure was treated for the lateral and medial supracondylar areas. Patient tolerated procedure well. Post procedure instructions given.

## 2021-02-20 ENCOUNTER — Ambulatory Visit (INDEPENDENT_AMBULATORY_CARE_PROVIDER_SITE_OTHER): Payer: Medicare Other

## 2021-02-20 ENCOUNTER — Other Ambulatory Visit: Payer: Self-pay

## 2021-02-20 DIAGNOSIS — I712 Thoracic aortic aneurysm, without rupture, unspecified: Secondary | ICD-10-CM

## 2021-02-20 DIAGNOSIS — I471 Supraventricular tachycardia: Secondary | ICD-10-CM

## 2021-02-20 DIAGNOSIS — R0609 Other forms of dyspnea: Secondary | ICD-10-CM

## 2021-02-20 DIAGNOSIS — I10 Essential (primary) hypertension: Secondary | ICD-10-CM

## 2021-02-20 DIAGNOSIS — I7121 Aneurysm of the ascending aorta, without rupture: Secondary | ICD-10-CM

## 2021-02-20 DIAGNOSIS — E7800 Pure hypercholesterolemia, unspecified: Secondary | ICD-10-CM

## 2021-02-20 DIAGNOSIS — E785 Hyperlipidemia, unspecified: Secondary | ICD-10-CM

## 2021-02-20 DIAGNOSIS — R0600 Dyspnea, unspecified: Secondary | ICD-10-CM

## 2021-02-22 ENCOUNTER — Telehealth: Payer: Self-pay | Admitting: Cardiology

## 2021-02-22 NOTE — Telephone Encounter (Signed)
Patient is returning call to discuss AAA Duplex results.

## 2021-02-23 NOTE — Telephone Encounter (Signed)
Spoke with patient, see chart.    

## 2021-02-24 DIAGNOSIS — D510 Vitamin B12 deficiency anemia due to intrinsic factor deficiency: Secondary | ICD-10-CM | POA: Diagnosis not present

## 2021-03-29 DIAGNOSIS — E538 Deficiency of other specified B group vitamins: Secondary | ICD-10-CM | POA: Diagnosis not present

## 2021-03-30 ENCOUNTER — Other Ambulatory Visit: Payer: Self-pay

## 2021-03-30 ENCOUNTER — Encounter: Payer: Self-pay | Admitting: Physical Medicine & Rehabilitation

## 2021-03-30 ENCOUNTER — Encounter: Payer: Medicare Other | Attending: Physical Medicine & Rehabilitation | Admitting: Physical Medicine & Rehabilitation

## 2021-03-30 VITALS — BP 157/79 | HR 61 | Temp 98.2°F | Ht 68.0 in | Wt 208.0 lb

## 2021-03-30 DIAGNOSIS — G8928 Other chronic postprocedural pain: Secondary | ICD-10-CM | POA: Diagnosis not present

## 2021-03-30 NOTE — Patient Instructions (Signed)
Please call when Left knee pain is >4/10 on a consistent basis, if it is <20months after the injection will schedule Genicular RF if > 48mo post injection repeat genicular nerve block

## 2021-03-30 NOTE — Progress Notes (Signed)
  PROCEDURE RECORD [facility]   Name: MAKALAH ASBERRY DOB:Dec 21, 1940 MRN: [id number]  Date:03/30/2021  Physician: Claudette Laws, MD    Nurse/CMA: Charise Carwin MA  Allergies:  Allergies  Allergen Reactions   Methocarbamol Hives    ask    Tetracyclines & Related Hives   Tetracycline Hives   Ace Inhibitors Cough    Consent Signed: Yes.    Is patient diabetic? No.  CBG today? N/A  Pregnant: No. LMP: No LMP recorded. Patient has had a hysterectomy. (age 12-55)  Anticoagulants: no Anti-inflammatory: no Antibiotics: no  Procedure: Left Knee Genicular Radiofrequency   Position: Supine Start Time:   End Time:   Fluoro Time:   RN/CMA Ishana Blades MA Macee Venables MA    Time      BP      Pulse      Respirations      O2 Sat      S/S      Pain Level       D/C home with Self, patient A & O X 3, D/C instructions reviewed, and sits independently.

## 2021-03-30 NOTE — Progress Notes (Signed)
Subjective:    Patient ID: Debra Harrison, female    DOB: 1940-08-17, 80 y.o.   MRN: [id number]  HPI   Pain Inventory Average Pain 1 Pain Right Now 1 My pain is intermittent  In the last 24 hours, has pain interfered with the following? General activity 0 Relation with others 0 Enjoyment of life 0 What TIME of day is your pain at its worst? night Sleep (in general) Fair  Pain is worse with: unsure Pain improves with: injections Relief from Meds:  3 taken for feet not knee.  Family History  Problem Relation Age of Onset   Congestive Heart Failure Mother    Colon cancer Mother    Diabetes Mother    Hypertension Mother    Heart failure Father    Diabetes Father    Hypertension Father    Heart disease Father    AAA (abdominal aortic aneurysm) Brother    Thyroid disease Other    Leukemia Brother    Irregular heart beat Brother    Heart disease Brother    Social History   Socioeconomic History   Marital status: Widowed    Spouse name: Not on file   Number of children: Not on file   Years of education: Not on file   Highest education level: Not on file  Occupational History   Not on file  Tobacco Use   Smoking status: Former    Years: 15.00    Types: Cigarettes    Quit date: 109    Years since quitting: 35.7   Smokeless tobacco: Never  Vaping Use   Vaping Use: Never used  Substance and Sexual Activity   Alcohol use: Yes    Comment: occasionally, on a less than daily basis   Drug use: No   Sexual activity: Not on file  Other Topics Concern   Not on file  Social History Narrative   Not on file   Social Determinants of Health   Financial Resource Strain: Not on file  Food Insecurity: Not on file  Transportation Needs: Not on file  Physical Activity: Not on file  Stress: Not on file  Social Connections: Not on file   Past Surgical History:  Procedure Laterality Date   ABDOMINAL HYSTERECTOMY     KNEE ARTHROSCOPY Right 05/2007   left total knee  arthroscopy  05/30/2016   Dr. Mardene Speak   TOTAL HIP ARTHROPLASTY Right 02/01/2008   Past Surgical History:  Procedure Laterality Date   ABDOMINAL HYSTERECTOMY     KNEE ARTHROSCOPY Right 05/2007   left total knee arthroscopy  05/30/2016   Dr. Mardene Speak   TOTAL HIP ARTHROPLASTY Right 02/01/2008   Past Medical History:  Diagnosis Date   Abnormal electrocardiogram (ECG) (EKG) 09/18/2017   Acid reflux    Adult-onset obesity 09/09/2017   Ankle edema 09/09/2017   APC (atrial premature contractions) 09/18/2017   Benign essential hypertension 09/09/2017   Benign paroxysmal vertigo 09/09/2017   Bilateral carotid bruits 08/28/2016   Borderline hyperglycemia 09/09/2017   Cervical radiculopathy 08/28/2016   Chest pain 09/09/2017   Chronic renal disease, stage III (HCC) 09/09/2017   Degenerative arthritis of hip    Dyslipidemia 09/09/2017   Dysphagia 09/09/2017   GERD (gastroesophageal reflux disease) 09/09/2017   Hypercholesterolemia    Hypertension    Idiopathic peripheral neuropathy 08/28/2016   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   Irregular heart rate 09/09/2017   Localized osteoarthrosis, lower leg 09/09/2017   Lower limb length difference 03/12/2017  Osteoarthritis    Pernicious anemia 09/09/2017   Second degree heart block 09/09/2017   Spinal cord mass (HCC) 08/28/2016   Thyroid nodule 09/11/2016   Noted on carotid dopplers through neurology 08/30/2016   Total knee replacement status, left    Vitamin B 12 deficiency 09/09/2017   Wrist fracture 05/2001   BP (!) 157/79   Pulse 61   Temp 98.2 F (36.8 C)   Ht 5\' 8"  (1.727 m)   Wt 208 lb (94.3 kg)   SpO2 97%   BMI 31.63 kg/m   Opioid Risk Score:   Fall Risk Score:  `1  Depression screen PHQ 2/9  Depression screen [facility] 2/9 03/30/2021 02/17/2021 01/13/2021  Decreased Interest 0 0 0  Down, Depressed, Hopeless 0 0 0  PHQ - 2 Score 0 0 0  Altered sleeping - - 0  Tired, decreased energy - - 2  Change in appetite - - 0  Feeling bad or failure  about yourself  - - 0  Trouble concentrating - - 0  Moving slowly or fidgety/restless - - 0  Suicidal thoughts - - 0  PHQ-9 Score - - 2    Review of Systems  Musculoskeletal:        Left Knee   All other systems reviewed and are negative.     Objective:   Physical Exam        Assessment & Plan:

## 2021-03-30 NOTE — Progress Notes (Signed)
Subjective:    Patient ID: Debra Harrison, female    DOB: 1941-01-24, 80 y.o.   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  She has been evaluated by orthopedics and there was no loosening or signs of infection. The patient has no significant back pain.  She has a chronic issue with bilateral foot pain following spinal anesthetic and sees Dr. Ethelene Hal for this. She was last seen for left knee genicular nerve blocks on 02/17/2021.  She had preinjection pain of 7 out of 10 and postinjection pain is 0 out of 10.  The patient now is having 1/10 pain.  She has no additional pain with standing or walking.  Pain Inventory Average Pain 1 Pain Right Now 1 My pain is intermittent  In the last 24 hours, has pain interfered with the following? General activity 0 Relation with others 0 Enjoyment of life 0 What TIME of day is your pain at its worst? night Sleep (in general) Fair  Pain is worse with: unsure Pain improves with: injections Relief from Meds:  3 taken for feet not knee.  Family History  Problem Relation Age of Onset   Congestive Heart Failure Mother    Colon cancer Mother    Diabetes Mother    Hypertension Mother    Heart failure Father    Diabetes Father    Hypertension Father    Heart disease Father    AAA (abdominal aortic aneurysm) Brother    Thyroid disease Other    Leukemia Brother    Irregular heart beat Brother    Heart disease Brother    Social History   Socioeconomic History   Marital status: Widowed    Spouse name: Not on file   Number of children: Not on file   Years of education: Not on file   Highest education level: Not on file  Occupational History   Not on file  Tobacco Use   Smoking status: Former    Years: 15.00    Types: Cigarettes    Quit date: 47    Years since quitting: 35.7   Smokeless tobacco: Never  Vaping Use   Vaping Use: Never used  Substance and  Sexual Activity   Alcohol use: Yes    Comment: occasionally, on a less than daily basis   Drug use: No   Sexual activity: Not on file  Other Topics Concern   Not on file  Social History Narrative   Not on file   Social Determinants of Health   Financial Resource Strain: Not on file  Food Insecurity: Not on file  Transportation Needs: Not on file  Physical Activity: Not on file  Stress: Not on file  Social Connections: Not on file   Past Surgical History:  Procedure Laterality Date   ABDOMINAL HYSTERECTOMY     KNEE ARTHROSCOPY Right 05/2007   left total knee arthroscopy  05/30/2016   Dr. Mardene Speak   TOTAL HIP ARTHROPLASTY Right 02/01/2008   Past Surgical History:  Procedure Laterality Date   ABDOMINAL HYSTERECTOMY     KNEE ARTHROSCOPY Right 05/2007   left total knee arthroscopy  05/30/2016   Dr. Mardene Speak   TOTAL HIP ARTHROPLASTY Right 02/01/2008   Past Medical History:  Diagnosis Date   Abnormal electrocardiogram (ECG) (EKG) 09/18/2017   Acid reflux    Adult-onset obesity 09/09/2017   Ankle edema 09/09/2017   APC (atrial premature contractions) 09/18/2017   Benign essential  hypertension 09/09/2017   Benign paroxysmal vertigo 09/09/2017   Bilateral carotid bruits 08/28/2016   Borderline hyperglycemia 09/09/2017   Cervical radiculopathy 08/28/2016   Chest pain 09/09/2017   Chronic renal disease, stage III (HCC) 09/09/2017   Degenerative arthritis of hip    Dyslipidemia 09/09/2017   Dysphagia 09/09/2017   GERD (gastroesophageal reflux disease) 09/09/2017   Hypercholesterolemia    Hypertension    Idiopathic peripheral neuropathy 08/28/2016   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   Irregular heart rate 09/09/2017   Localized osteoarthrosis, lower leg 09/09/2017   Lower limb length difference 03/12/2017   Osteoarthritis    Pernicious anemia 09/09/2017   Second degree heart block 09/09/2017   Spinal cord mass (HCC) 08/28/2016   Thyroid nodule 09/11/2016   Noted on carotid dopplers  through neurology 08/30/2016   Total knee replacement status, left    Vitamin B 12 deficiency 09/09/2017   Wrist fracture 05/2001   BP (!) 157/79   Pulse 61   Temp 98.2 F (36.8 C)   Ht 5\' 8"  (1.727 m)   Wt 208 lb (94.3 kg)   SpO2 97%   BMI 31.63 kg/m   Opioid Risk Score:   Fall Risk Score:  `1  Depression screen PHQ 2/9  Depression screen [facility] 2/9 03/30/2021 02/17/2021 01/13/2021  Decreased Interest 0 0 0  Down, Depressed, Hopeless 0 0 0  PHQ - 2 Score 0 0 0  Altered sleeping - - 0  Tired, decreased energy - - 2  Change in appetite - - 0  Feeling bad or failure about yourself  - - 0  Trouble concentrating - - 0  Moving slowly or fidgety/restless - - 0  Suicidal thoughts - - 0  PHQ-9 Score - - 2    Review of Systems  Musculoskeletal:        Left Knee   All other systems reviewed and are negative.     Objective:   Physical Exam Vitals and nursing note reviewed.  Constitutional:      Appearance: She is obese.  HENT:     Head: Normocephalic and atraumatic.  Eyes:     Extraocular Movements: Extraocular movements intact.     Conjunctiva/sclera: Conjunctivae normal.     Pupils: Pupils are equal, round, and reactive to light.  Musculoskeletal:     Comments: No evidence of left knee effusion there is a well-healed surgical incision.  No hypersensitivity to touch Left knee range of motion full extension flexion. No pain with ROM.  Skin:    General: Skin is warm and dry.  Neurological:     Mental Status: She is alert and oriented to person, place, and time.  Psychiatric:        Mood and Affect: Mood normal.        Behavior: Behavior normal.   Motor strength is normal in the left lower extremity No pain with standing       Assessment & Plan:  #1.  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  Her pain has  increased slightly to a 1/10-but still at a  very mild level.  Do not think she should have radiofrequency neurotomy at this time.  She is having a prolonged duration of response to the genicular nerve block.  It is difficult when this will stop working,.  The patient is to call once her pain level is 4 or greater to schedule  a repeat injection.  If the duration of response is every 6 months would repeat genicular nerve block and if the duration of response is less than 6 months would proceed to radiofrequency neurotomy

## 2021-04-14 ENCOUNTER — Ambulatory Visit: Payer: Medicare Other | Admitting: Cardiology

## 2021-04-17 DIAGNOSIS — R000 Tachycardia, unspecified: Secondary | ICD-10-CM | POA: Diagnosis not present

## 2021-04-17 DIAGNOSIS — R0789 Other chest pain: Secondary | ICD-10-CM | POA: Diagnosis not present

## 2021-04-17 DIAGNOSIS — Z87891 Personal history of nicotine dependence: Secondary | ICD-10-CM | POA: Diagnosis not present

## 2021-04-17 DIAGNOSIS — J029 Acute pharyngitis, unspecified: Secondary | ICD-10-CM | POA: Diagnosis not present

## 2021-04-17 DIAGNOSIS — R079 Chest pain, unspecified: Secondary | ICD-10-CM | POA: Diagnosis not present

## 2021-04-17 DIAGNOSIS — I10 Essential (primary) hypertension: Secondary | ICD-10-CM | POA: Diagnosis not present

## 2021-04-17 DIAGNOSIS — R0902 Hypoxemia: Secondary | ICD-10-CM | POA: Diagnosis not present

## 2021-04-17 DIAGNOSIS — I499 Cardiac arrhythmia, unspecified: Secondary | ICD-10-CM | POA: Diagnosis not present

## 2021-04-17 DIAGNOSIS — R070 Pain in throat: Secondary | ICD-10-CM | POA: Diagnosis not present

## 2021-04-27 ENCOUNTER — Ambulatory Visit: Payer: Medicare Other | Admitting: Physical Medicine & Rehabilitation

## 2021-05-03 DIAGNOSIS — I10 Essential (primary) hypertension: Secondary | ICD-10-CM | POA: Diagnosis not present

## 2021-05-03 DIAGNOSIS — G720 Drug-induced myopathy: Secondary | ICD-10-CM | POA: Diagnosis not present

## 2021-05-03 DIAGNOSIS — Z23 Encounter for immunization: Secondary | ICD-10-CM | POA: Diagnosis not present

## 2021-05-03 DIAGNOSIS — E538 Deficiency of other specified B group vitamins: Secondary | ICD-10-CM | POA: Diagnosis not present

## 2021-05-03 DIAGNOSIS — T466X5A Adverse effect of antihyperlipidemic and antiarteriosclerotic drugs, initial encounter: Secondary | ICD-10-CM | POA: Diagnosis not present

## 2021-05-03 DIAGNOSIS — I471 Supraventricular tachycardia: Secondary | ICD-10-CM | POA: Diagnosis not present

## 2021-06-02 ENCOUNTER — Ambulatory Visit (INDEPENDENT_AMBULATORY_CARE_PROVIDER_SITE_OTHER): Payer: Medicare Other | Admitting: Cardiology

## 2021-06-02 ENCOUNTER — Other Ambulatory Visit: Payer: Self-pay

## 2021-06-02 ENCOUNTER — Encounter: Payer: Self-pay | Admitting: Cardiology

## 2021-06-02 VITALS — BP 160/80 | HR 74 | Ht 68.0 in | Wt 205.2 lb

## 2021-06-02 DIAGNOSIS — I10 Essential (primary) hypertension: Secondary | ICD-10-CM

## 2021-06-02 DIAGNOSIS — R079 Chest pain, unspecified: Secondary | ICD-10-CM | POA: Diagnosis not present

## 2021-06-02 DIAGNOSIS — I471 Supraventricular tachycardia: Secondary | ICD-10-CM

## 2021-06-02 DIAGNOSIS — I7121 Aneurysm of the ascending aorta, without rupture: Secondary | ICD-10-CM | POA: Diagnosis not present

## 2021-06-02 DIAGNOSIS — E7800 Pure hypercholesterolemia, unspecified: Secondary | ICD-10-CM

## 2021-06-02 MED ORDER — METOPROLOL SUCCINATE ER 50 MG PO TB24: 50.0000 mg | ORAL_TABLET | Freq: Every day | ORAL | 1 refills | Status: DC

## 2021-06-02 NOTE — Patient Instructions (Signed)
Medication Instructions:  Your physician has recommended you make the following change in your medication:  INCREASE: metoprolol to 50 mg daily   *If you need a refill on your cardiac medications before your next appointment, please call your pharmacy*   Lab Work: None If you have labs (blood work) drawn today and your tests are completely normal, you will receive your results only by: MyChart Message (if you have MyChart) OR A paper copy in the mail If you have any lab test that is abnormal or we need to change your treatment, we will call you to review the results.   Testing/Procedures:   [facility] [address] [place], [place] [address] Phone:  [phone]    Please arrive 15 minutes prior to your appointment time for registration and insurance purposes.  The test will take approximately 3 to 4 hours to complete; you may bring reading material.  If someone comes with you to your appointment, they will need to remain in the main lobby due to limited space in the testing area. **If you are pregnant or breastfeeding, please notify the nuclear lab prior to your appointment**  How to prepare for your Myocardial Perfusion Test: Do not eat or drink 3 hours prior to your test, except you may have water. Do not consume products containing caffeine (regular or decaffeinated) 12 hours prior to your test. (ex: coffee, chocolate, sodas, tea). Do bring a list of your current medications with you.  If not listed below, you may take your medications as normal. Do wear comfortable clothes (no dresses or overalls) and walking shoes, tennis shoes preferred (No heels or open toe shoes are allowed). Do NOT wear cologne, perfume, aftershave, or lotions (deodorant is allowed). If these instructions are not followed, your test will have to be rescheduled.  Please report to [address] for your test.  If you have questions or concerns about your appointment,  you can call the [facility] at [phone].  If you cannot keep your appointment, please provide 24 hours notification to the Nuclear Lab, to avoid a possible $50 charge to your account.    Follow-Up: At [facility], you and your health needs are our priority.  As part of our continuing mission to provide you with exceptional heart care, we have created designated Provider Care Teams.  These Care Teams include your primary Cardiologist (physician) and Advanced Practice Providers (APPs -  Physician Assistants and Nurse Practitioners) who all work together to provide you with the care you need, when you need it.  We recommend signing up for the patient portal called "MyChart".  Sign up information is provided on this After Visit Summary.  MyChart is used to connect with patients for Virtual Visits (Telemedicine).  Patients are able to view lab/test results, encounter notes, upcoming appointments, etc.  Non-urgent messages can be sent to your provider as well.   To learn more about what you can do with MyChart, go to [email or web address].    Your next appointment:   2 month(s)  The format for your next appointment:   In Person  Provider:   Gypsy Balsam, MD    Other Instructions  Cardiac Nuclear Scan A cardiac nuclear scan is a test that measures blood flow to the heart when a person is resting and when he or she is exercising. The test looks for problems such as: Not enough blood reaching a portion of the heart. The heart muscle not  working normally. You may need this test if: You have heart disease. You have had abnormal lab results. You have had heart surgery or a balloon procedure to open up blocked arteries (angioplasty). You have chest pain. You have shortness of breath. In this test, a radioactive dye (tracer) is injected into your bloodstream. After the tracer has traveled to your heart, an imaging device is used to measure how much of  the tracer is absorbed by or distributed to various areas of your heart. This procedure is usually done at a hospital and takes 2-4 hours. Tell a health care provider about: Any allergies you have. All medicines you are taking, including vitamins, herbs, eye drops, creams, and over-the-counter medicines. Any problems you or family members have had with anesthetic medicines. Any blood disorders you have. Any surgeries you have had. Any medical conditions you have. Whether you are pregnant or may be pregnant. What are the risks? Generally, this is a safe procedure. However, problems may occur, including: Serious chest pain and heart attack. This is only a risk if the stress portion of the test is done. Rapid heartbeat. Sensation of warmth in your chest. This usually passes quickly. Allergic reaction to the tracer. What happens before the procedure? Ask your health care provider about changing or stopping your regular medicines. This is especially important if you are taking diabetes medicines or blood thinners. Follow instructions from your health care provider about eating or drinking restrictions. Remove your jewelry on the day of the procedure. What happens during the procedure? An IV will be inserted into one of your veins. Your health care provider will inject a small amount of radioactive tracer through the IV. You will wait for 20-40 minutes while the tracer travels through your bloodstream. Your heart activity will be monitored with an electrocardiogram (ECG). You will lie down on an exam table. Images of your heart will be taken for about 15-20 minutes. You may also have a stress test. For this test, one of the following may be done: You will exercise on a treadmill or stationary bike. While you exercise, your heart's activity will be monitored with an ECG, and your blood pressure will be checked. You will be given medicines that will increase blood flow to parts of your heart.  This is done if you are unable to exercise. When blood flow to your heart has peaked, a tracer will again be injected through the IV. After 20-40 minutes, you will get back on the exam table and have more images taken of your heart. Depending on the type of tracer used, scans may need to be repeated 3-4 hours later. Your IV line will be removed when the procedure is over. The procedure may vary among health care providers and hospitals. What happens after the procedure? Unless your health care provider tells you otherwise, you may return to your normal schedule, including diet, activities, and medicines. Unless your health care provider tells you otherwise, you may increase your fluid intake. This will help to flush the contrast dye from your body. Drink enough fluid to keep your urine pale yellow. Ask your health care provider, or the department that is doing the test: When will my results be ready? How will I get my results? Summary A cardiac nuclear scan measures the blood flow to the heart when a person is resting and when he or she is exercising. Tell your health care provider if you are pregnant. Before the procedure, ask your health care provider about  changing or stopping your regular medicines. This is especially important if you are taking diabetes medicines or blood thinners. After the procedure, unless your health care provider tells you otherwise, increase your fluid intake. This will help flush the contrast dye from your body. After the procedure, unless your health care provider tells you otherwise, you may return to your normal schedule, including diet, activities, and medicines. This information is not intended to replace advice given to you by your health care provider. Make sure you discuss any questions you have with your health care provider. Document Revised: 03/15/2021 Document Reviewed: 12/14/2020 Elsevier Patient Education  2022 [employer].

## 2021-06-02 NOTE — Progress Notes (Signed)
Cardiology Office Note:    Date:  06/02/2021   ID:  Debra Harrison, DOB February 25, 1941, MRN [id number]  PCP:  Street, [name], MD  Cardiologist:  Gypsy Balsam, MD    Referring MD: Street, [name], *   Chief Complaint  Patient presents with   Hospitalization Follow-up    History of Present Illness:    Debra Harrison is a 80 y.o. female   who with past medical history significant for palpitations, chronic kidney failure, B12 deficiency, status post left knee replacement she was referred to [place] originally because of dizzy spells.  I put monitor on her monitor shows frequent episode of supraventricular tachycardia, I put her on small dose of beta-blocker metoprolol 50 mg and she is doing much better.  She complains however being sleepy and today she seems to be bradycardic.  No dizziness no passing out.  She did also have echocardiogram which showed enlargement of her ascending aorta measuring 42 mm.  Interestingly she does have multiple family members total of 4 family members with aneurysms.  Clearly there is some family are predisposition for this and need to be investigated. Eventually we had to cut down dose of metoprolol because of bradycardia however, recently she ended up [facility] because of cold-like symptoms.  EKG was done and she was told she must go to the emergency room which she did in the emergency room nothing pertinent was identified she was given a higher dose of beta-blocker actually advice was to go on 100 mg of metoprolol succinate which is too much probably likely she did not do which she went to 50 mg.  Since that time she is feeling better this much less palpitations.  Overall she is getting better from her call.  We did do CT of her chest to look at the size of the arteries which is 42 mm which is stable we will continue monitoring.  Past Medical History:  Diagnosis Date   Abnormal electrocardiogram (ECG) (EKG) 09/18/2017    Acid reflux    Adult-onset obesity 09/09/2017   Ankle edema 09/09/2017   APC (atrial premature contractions) 09/18/2017   Benign essential hypertension 09/09/2017   Benign paroxysmal vertigo 09/09/2017   Bilateral carotid bruits 08/28/2016   Borderline hyperglycemia 09/09/2017   Cervical radiculopathy 08/28/2016   Chest pain 09/09/2017   Chronic renal disease, stage III (HCC) 09/09/2017   Degenerative arthritis of hip    Dyslipidemia 09/09/2017   Dysphagia 09/09/2017   GERD (gastroesophageal reflux disease) 09/09/2017   Hypercholesterolemia    Hypertension    Idiopathic peripheral neuropathy 08/28/2016   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 Mardene Speak   TOTAL HIP ARTHROPLASTY Right 02/01/2008    Current Medications: Current Meds  Medication Sig   cyclobenzaprine (FLEXERIL) 10 MG tablet Take 10 mg by mouth 3 (three)  times daily as needed for muscle spasms.   furosemide (LASIX) 40 MG tablet Take 40 mg by mouth daily as needed for fluid or edema.   lidocaine (LIDODERM) 5 % Place 1 patch onto the skin daily.   losartan (COZAAR) 100 MG tablet Take 100 mg by mouth daily.   metoprolol succinate (TOPROL XL) 25 MG 24 hr tablet Take 1 tablet (25 mg total) by mouth daily.   omeprazole (PRILOSEC) 40 MG capsule Take 1 capsule by mouth as needed for indigestion. Hx of bleeding ulcers   traMADol (ULTRAM) 50 MG tablet  Take 50 mg by mouth as needed for pain.     Allergies:   Methocarbamol, Tetracyclines & related, Tetracycline, and Ace inhibitors   Social History   Socioeconomic History   Marital status: Widowed    Spouse name: Not on file   Number of children: Not on file   Years of education: Not on file   Highest education level: Not on file  Occupational History   Not on file  Tobacco Use   Smoking status: Former    Years: 15.00    Types: Cigarettes    Quit date: 42    Years since quitting: 35.9   Smokeless tobacco: Never  Vaping Use   Vaping Use: Never used  Substance and Sexual Activity   Alcohol use: Yes    Comment: occasionally, on a less than daily basis   Drug use: No   Sexual activity: Not on file  Other Topics Concern   Not on file  Social History Narrative   Not on file   Social Determinants of Health   Financial Resource Strain: Not on file  Food Insecurity: Not on file  Transportation Needs: Not on file  Physical Activity: Not on file  Stress: Not on file  Social Connections: Not on file     Family History: The patient's family history includes AAA (abdominal aortic aneurysm) in her brother; Colon cancer in her mother; Congestive Heart Failure in her mother; Diabetes in her father and mother; Heart disease in her brother and father; Heart failure in her father; Hypertension in her father and mother; Irregular heart beat in her brother; Leukemia in her brother; Thyroid disease in an other family member. ROS:   Please see the history of present illness.    All 14 point review of systems negative except as described per history of present illness  EKGs/Labs/Other Studies Reviewed:      Recent Labs: 01/19/2021: BUN 13; Creatinine, Ser 0.80; Potassium 4.7; Sodium 141  Recent Lipid Panel No results found for: CHOL, TRIG, HDL, CHOLHDL, VLDL, LDLCALC, LDLDIRECT  Physical Exam:    VS:  BP (!) 160/80 (BP Location: Right Arm, Patient Position: Sitting)   Pulse 74    Ht 5\' 8"  (1.727 m)   Wt 205 lb 3.2 oz (93.1 kg)   SpO2 96%   BMI 31.20 kg/m     Wt Readings from Last 3 Encounters:  06/02/21 205 lb 3.2 oz (93.1 kg)  03/30/21 208 lb (94.3 kg)  02/17/21 208 lb 9.6 oz (94.6 kg)     GEN:  Well nourished, well developed in no acute distress HEENT: Normal NECK: No JVD; No carotid bruits LYMPHATICS: No lymphadenopathy CARDIAC: RRR, no murmurs, no rubs, no gallops RESPIRATORY:  Clear to auscultation without rales, wheezing or rhonchi  ABDOMEN: Soft, non-tender, non-distended MUSCULOSKELETAL:  No edema; No deformity  SKIN: Warm and dry LOWER EXTREMITIES: no swelling NEUROLOGIC:  Alert and oriented x 3  PSYCHIATRIC:  Normal affect   ASSESSMENT:    1. Supraventricular tachycardia (HCC)   2. Aneurysm of ascending aorta without rupture   3. Benign essential hypertension   4. Hypercholesterolemia    PLAN:    In order of problems listed above:  Supraventricular tachycardia.  She seems to be relatively controlled with 50 mg metoprolol succinate which I will continue.  I warned her about potential signs and symptoms of bradycardia including dizziness and syncope. Arteries of the ascending aorta is stable 42 mm.  The key is risk factors modification including blood pressure control Benign essential hypertension blood pressure elevated today but she described multiple episode her blood pressure is low.  On top of that when she was in the emergency room she was complaining of having chest heaviness.  I will schedule her to have Lexiscan to make sure she does not have any inducible ischemia. Dyslipidemia: I did review K PN which show me data from 2021.  We will try to make arrangements for another fasting lipid profile. I did review record from the emergency room for this visit   Medication Adjustments/Labs and Tests Ordered: Current medicines are reviewed at length with the patient today.  Concerns regarding medicines are outlined above.  No orders of the  defined types were placed in this encounter.  Medication changes: No orders of the defined types were placed in this encounter.   Signed, Georgeanna Lea, MD, [facility] 06/02/2021 3:07 PM    [facility]

## 2021-06-06 DIAGNOSIS — T466X5A Adverse effect of antihyperlipidemic and antiarteriosclerotic drugs, initial encounter: Secondary | ICD-10-CM | POA: Diagnosis not present

## 2021-06-06 DIAGNOSIS — R7302 Impaired glucose tolerance (oral): Secondary | ICD-10-CM | POA: Diagnosis not present

## 2021-06-06 DIAGNOSIS — G720 Drug-induced myopathy: Secondary | ICD-10-CM | POA: Diagnosis not present

## 2021-06-06 DIAGNOSIS — Z23 Encounter for immunization: Secondary | ICD-10-CM | POA: Diagnosis not present

## 2021-06-06 DIAGNOSIS — E785 Hyperlipidemia, unspecified: Secondary | ICD-10-CM | POA: Diagnosis not present

## 2021-06-06 DIAGNOSIS — E538 Deficiency of other specified B group vitamins: Secondary | ICD-10-CM | POA: Diagnosis not present

## 2021-06-06 DIAGNOSIS — Z0000 Encounter for general adult medical examination without abnormal findings: Secondary | ICD-10-CM | POA: Diagnosis not present

## 2021-06-06 DIAGNOSIS — I471 Supraventricular tachycardia: Secondary | ICD-10-CM | POA: Diagnosis not present

## 2021-06-07 ENCOUNTER — Telehealth: Payer: Self-pay

## 2021-06-07 NOTE — Telephone Encounter (Signed)
Detailed instructions left on the patient's answering machine. Asked to call back with any questions. S.Juwaun Inskeep EMTP 

## 2021-06-13 ENCOUNTER — Ambulatory Visit (INDEPENDENT_AMBULATORY_CARE_PROVIDER_SITE_OTHER): Payer: Medicare Other

## 2021-06-13 ENCOUNTER — Other Ambulatory Visit: Payer: Self-pay

## 2021-06-13 DIAGNOSIS — I7121 Aneurysm of the ascending aorta, without rupture: Secondary | ICD-10-CM

## 2021-06-13 DIAGNOSIS — R079 Chest pain, unspecified: Secondary | ICD-10-CM

## 2021-06-13 DIAGNOSIS — I471 Supraventricular tachycardia: Secondary | ICD-10-CM | POA: Diagnosis not present

## 2021-06-13 LAB — MYOCARDIAL PERFUSION IMAGING
LV dias vol: 67 mL (ref 46–106)
LV sys vol: 21 mL
Nuc Stress EF: 68 %
Peak HR: 100 {beats}/min
Rest HR: 60 {beats}/min
Rest Nuclear Isotope Dose: 8.6 mCi
SDS: 3
SRS: 1
SSS: 4
Stress Nuclear Isotope Dose: 26 mCi
TID: 0.96

## 2021-06-13 MED ORDER — TECHNETIUM TC 99M TETROFOSMIN IV KIT
8.6000 | PACK | Freq: Once | INTRAVENOUS | Status: AC | PRN
  Administered 2021-06-13: 8.8 via INTRAVENOUS

## 2021-06-13 MED ORDER — REGADENOSON 0.4 MG/5ML IV SOLN
0.4000 mg | Freq: Once | INTRAVENOUS | Status: AC
  Administered 2021-06-13: 0.4 mg via INTRAVENOUS

## 2021-06-13 MED ORDER — TECHNETIUM TC 99M TETROFOSMIN IV KIT
26.0000 | PACK | Freq: Once | INTRAVENOUS | Status: AC | PRN
  Administered 2021-06-13: 26 via INTRAVENOUS

## 2021-07-12 DIAGNOSIS — D519 Vitamin B12 deficiency anemia, unspecified: Secondary | ICD-10-CM | POA: Diagnosis not present

## 2021-07-20 DIAGNOSIS — G90521 Complex regional pain syndrome I of right lower limb: Secondary | ICD-10-CM | POA: Diagnosis not present

## 2021-07-27 DIAGNOSIS — G90521 Complex regional pain syndrome I of right lower limb: Secondary | ICD-10-CM | POA: Diagnosis not present

## 2021-08-03 DIAGNOSIS — G90521 Complex regional pain syndrome I of right lower limb: Secondary | ICD-10-CM | POA: Diagnosis not present

## 2021-08-09 ENCOUNTER — Other Ambulatory Visit: Payer: Self-pay

## 2021-08-09 ENCOUNTER — Encounter: Payer: Self-pay | Admitting: Cardiology

## 2021-08-09 ENCOUNTER — Ambulatory Visit (INDEPENDENT_AMBULATORY_CARE_PROVIDER_SITE_OTHER): Payer: Medicare Other | Admitting: Cardiology

## 2021-08-09 VITALS — BP 144/90 | HR 82 | Ht 68.0 in | Wt 208.4 lb

## 2021-08-09 DIAGNOSIS — I471 Supraventricular tachycardia: Secondary | ICD-10-CM

## 2021-08-09 DIAGNOSIS — D519 Vitamin B12 deficiency anemia, unspecified: Secondary | ICD-10-CM | POA: Diagnosis not present

## 2021-08-09 DIAGNOSIS — I10 Essential (primary) hypertension: Secondary | ICD-10-CM

## 2021-08-09 DIAGNOSIS — E7800 Pure hypercholesterolemia, unspecified: Secondary | ICD-10-CM | POA: Diagnosis not present

## 2021-08-09 DIAGNOSIS — I7121 Aneurysm of the ascending aorta, without rupture: Secondary | ICD-10-CM

## 2021-08-09 MED ORDER — AMLODIPINE BESYLATE 5 MG PO TABS: 5.0000 mg | ORAL_TABLET | Freq: Every day | ORAL | 3 refills | Status: DC

## 2021-08-09 NOTE — Addendum Note (Signed)
Addended by: Roxanne Mins I on: 08/09/2021 03:29 PM   Modules accepted: Orders

## 2021-08-09 NOTE — Patient Instructions (Signed)
Medication Instructions:  ?Your physician has recommended you make the following change in your medication:  ? ?START: Amlodipine 5 mg daily ? ?*If you need a refill on your cardiac medications before your next appointment, please call your pharmacy* ? ? ?Lab Work: ?None ?If you have labs (blood work) drawn today and your tests are completely normal, you will receive your results only by: ?MyChart Message (if you have MyChart) OR ?A paper copy in the mail ?If you have any lab test that is abnormal or we need to change your treatment, we will call you to review the results. ? ? ?Testing/Procedures: ?None ? ? ?Follow-Up: ?At [facility], you and your health needs are our priority.  As part of our continuing mission to provide you with exceptional heart care, we have created designated Provider Care Teams.  These Care Teams include your primary Cardiologist (physician) and Advanced Practice Providers (APPs -  Physician Assistants and Nurse Practitioners) who all work together to provide you with the care you need, when you need it. ? ?We recommend signing up for the patient portal called "MyChart".  Sign up information is provided on this After Visit Summary.  MyChart is used to connect with patients for Virtual Visits (Telemedicine).  Patients are able to view lab/test results, encounter notes, upcoming appointments, etc.  Non-urgent messages can be sent to your provider as well.   ?To learn more about what you can do with MyChart, go to [email or web address].   ? ?Your next appointment:   ?6 month(s) ? ?The format for your next appointment:   ?In Person ? ?Provider:   ?Robert Krasowski, MD  ? ? ?Other Instructions ?None ? ?

## 2021-08-09 NOTE — Progress Notes (Signed)
Cardiology Office Note:    Date:  08/09/2021   ID:  Debra Harrison, DOB 05/22/1941, MRN [id number]  PCP:  Street, [name], MD  Cardiologist:  Gypsy Balsam, MD    Referring MD: Street, [name], *   Chief Complaint  Patient presents with   Follow-up   BP monitor follow up    History of Present Illness:    Debra Harrison is a 81 y.o. female  who with past medical history significant for palpitations, chronic kidney failure, B12 deficiency, status post left knee replacement she was referred to [place] originally because of dizzy spells.  I put monitor on her monitor shows frequent episode of supraventricular tachycardia, I put her on small dose of beta-blocker metoprolol 50 mg and she is doing much better.  She complains however being sleepy and today she seems to be bradycardic.  No dizziness no passing out.  She did also have echocardiogram which showed enlargement of her ascending aorta measuring 42 mm She comes today in Follow-Up.  Overall She Is Doing Very Well.  She Is a Good Holiday.  Denies Have Any Chest Pain Tightness Squeezing Pressure Burning Chest Left Ear Is Still a Problem.  No Palpitations  Past Medical History:  Diagnosis Date   Abnormal electrocardiogram (ECG) (EKG) 09/18/2017   Acid reflux    Adult-onset obesity 09/09/2017   Ankle edema 09/09/2017   APC (atrial premature contractions) 09/18/2017   Benign essential hypertension 09/09/2017   Benign paroxysmal vertigo 09/09/2017   Bilateral carotid bruits 08/28/2016   Borderline hyperglycemia 09/09/2017   Cervical radiculopathy 08/28/2016   Chest pain 09/09/2017   Chronic renal disease, stage III (HCC) 09/09/2017   Degenerative arthritis of hip    Dyslipidemia 09/09/2017   Dysphagia 09/09/2017   GERD (gastroesophageal reflux disease) 09/09/2017   Hypercholesterolemia    Hypertension    Idiopathic peripheral neuropathy 08/28/2016   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   Irregular heart rate 09/09/2017   Localized  osteoarthrosis, lower leg 09/09/2017   Lower limb length difference 03/12/2017   Osteoarthritis    Pernicious anemia 09/09/2017   Second degree heart block 09/09/2017   Spinal cord mass (HCC) 08/28/2016   Thyroid nodule 09/11/2016   Noted on carotid dopplers through neurology 08/30/2016   Total knee replacement status, left    Vitamin B 12 deficiency 09/09/2017   Wrist fracture 05/2001    Past Surgical History:  Procedure Laterality Date   ABDOMINAL HYSTERECTOMY     KNEE ARTHROSCOPY Right 05/2007   left total knee arthroscopy  05/30/2016   Dr. Mardene Speak   TOTAL HIP ARTHROPLASTY Right 02/01/2008    Current Medications: Current Meds  Medication Sig   furosemide (LASIX) 40 MG tablet Take 40 mg by mouth daily as needed for fluid or edema.   losartan (COZAAR) 100 MG tablet Take 100 mg by mouth daily.   metoprolol succinate (TOPROL XL) 50 MG 24 hr tablet Take 1 tablet (50 mg total) by mouth daily.   [DISCONTINUED] cyclobenzaprine (FLEXERIL) 10 MG tablet Take 10 mg by mouth 3 (three) times daily as needed for muscle spasms.   [DISCONTINUED] lidocaine (LIDODERM) 5 % Place 1 patch onto the skin daily.   [DISCONTINUED] omeprazole (PRILOSEC) 40 MG capsule Take 1 capsule by mouth as needed for indigestion. Hx of bleeding ulcers   [DISCONTINUED] traMADol (ULTRAM) 50 MG tablet Take 50 mg by mouth as needed for pain.     Allergies:   Methocarbamol, Tetracyclines & related, Tetracycline, and Ace  inhibitors   Social History   Socioeconomic History   Marital status: Widowed    Spouse name: Not on file   Number of children: Not on file   Years of education: Not on file   Highest education level: Not on file  Occupational History   Not on file  Tobacco Use   Smoking status: Former    Years: 15.00    Types: Cigarettes    Quit date: 30    Years since quitting: 36.0   Smokeless tobacco: Never  Vaping Use   Vaping Use: Never used  Substance and Sexual Activity   Alcohol use: Yes    Comment:  occasionally, on a less than daily basis   Drug use: No   Sexual activity: Not on file  Other Topics Concern   Not on file  Social History Narrative   Not on file   Social Determinants of Health   Financial Resource Strain: Not on file  Food Insecurity: Not on file  Transportation Needs: Not on file  Physical Activity: Not on file  Stress: Not on file  Social Connections: Not on file     Family History: The patient's family history includes AAA (abdominal aortic aneurysm) in her brother; Colon cancer in her mother; Congestive Heart Failure in her mother; Diabetes in her father and mother; Heart disease in her brother and father; Heart failure in her father; Hypertension in her father and mother; Irregular heart beat in her brother; Leukemia in her brother; Thyroid disease in an other family member. ROS:   Please see the history of present illness.    All 14 point review of systems negative except as described per history of present illness  EKGs/Labs/Other Studies Reviewed:      Recent Labs: 01/19/2021: BUN 13; Creatinine, Ser 0.80; Potassium 4.7; Sodium 141  Recent Lipid Panel No results found for: CHOL, TRIG, HDL, CHOLHDL, VLDL, LDLCALC, LDLDIRECT  Physical Exam:    VS:  BP (!) 144/90 (BP Location: Right Arm, Patient Position: Sitting)    Pulse 82    Ht 5\' 8"  (1.727 m)    Wt 208 lb 6.4 oz (94.5 kg)    SpO2 93%    BMI 31.69 kg/m     Wt Readings from Last 3 Encounters:  08/09/21 208 lb 6.4 oz (94.5 kg)  06/13/21 205 lb (93 kg)  06/02/21 205 lb 3.2 oz (93.1 kg)     GEN:  Well nourished, well developed in no acute distress HEENT: Normal NECK: No JVD; No carotid bruits LYMPHATICS: No lymphadenopathy CARDIAC: RRR, no murmurs, no rubs, no gallops RESPIRATORY:  Clear to auscultation without rales, wheezing or rhonchi  ABDOMEN: Soft, non-tender, non-distended MUSCULOSKELETAL:  No edema; No deformity  SKIN: Warm and dry LOWER EXTREMITIES: no swelling NEUROLOGIC:  Alert and  oriented x 3 PSYCHIATRIC:  Normal affect   ASSESSMENT:    1. Benign essential hypertension   2. Supraventricular tachycardia (HCC)   3. Aneurysm of ascending aorta without rupture   4. Hypercholesterolemia    PLAN:    In order of problems listed above:  Supraventricular tachycardia denies having any doing well with resolution continue. Essential hypertension: Blood pressure measurements to be still not well controlled.  I will add amlodipine 5 mg daily to her medical regimen. Ascending aortic aneurysm last echocardiogram done in May. Will repeat echocardiogram in May.  That he right now is modifiable risk factors Dyslipidemia I will call primary care physician to get her fasting lipid  Medication Adjustments/Labs  and Tests Ordered: Current medicines are reviewed at length with the patient today.  Concerns regarding medicines are outlined above.  No orders of the defined types were placed in this encounter.  Medication changes: No orders of the defined types were placed in this encounter.   Signed, Georgeanna Leaobert J. Cyndi Montejano, MD, [facility] 08/09/2021 3:09 PM    [facility]

## 2021-09-07 DIAGNOSIS — N950 Postmenopausal bleeding: Secondary | ICD-10-CM | POA: Diagnosis not present

## 2021-09-07 DIAGNOSIS — R319 Hematuria, unspecified: Secondary | ICD-10-CM | POA: Diagnosis not present

## 2021-09-07 DIAGNOSIS — E538 Deficiency of other specified B group vitamins: Secondary | ICD-10-CM | POA: Diagnosis not present

## 2021-09-07 DIAGNOSIS — Z6832 Body mass index (BMI) 32.0-32.9, adult: Secondary | ICD-10-CM | POA: Diagnosis not present

## 2021-09-12 DIAGNOSIS — G720 Drug-induced myopathy: Secondary | ICD-10-CM | POA: Diagnosis not present

## 2021-09-12 DIAGNOSIS — M1831 Unilateral post-traumatic osteoarthritis of first carpometacarpal joint, right hand: Secondary | ICD-10-CM | POA: Diagnosis not present

## 2021-09-13 DIAGNOSIS — Z90710 Acquired absence of both cervix and uterus: Secondary | ICD-10-CM | POA: Diagnosis not present

## 2021-09-13 DIAGNOSIS — N950 Postmenopausal bleeding: Secondary | ICD-10-CM | POA: Diagnosis not present

## 2021-09-29 ENCOUNTER — Other Ambulatory Visit: Payer: Self-pay | Admitting: Cardiology

## 2021-10-02 DIAGNOSIS — R310 Gross hematuria: Secondary | ICD-10-CM | POA: Diagnosis not present

## 2021-10-02 DIAGNOSIS — N950 Postmenopausal bleeding: Secondary | ICD-10-CM | POA: Diagnosis not present

## 2021-10-02 DIAGNOSIS — N1831 Chronic kidney disease, stage 3a: Secondary | ICD-10-CM | POA: Diagnosis not present

## 2021-10-02 DIAGNOSIS — I471 Supraventricular tachycardia: Secondary | ICD-10-CM | POA: Diagnosis not present

## 2021-10-06 DIAGNOSIS — N281 Cyst of kidney, acquired: Secondary | ICD-10-CM | POA: Diagnosis not present

## 2021-10-06 DIAGNOSIS — K5730 Diverticulosis of large intestine without perforation or abscess without bleeding: Secondary | ICD-10-CM | POA: Diagnosis not present

## 2021-10-06 DIAGNOSIS — R310 Gross hematuria: Secondary | ICD-10-CM | POA: Diagnosis not present

## 2021-10-06 DIAGNOSIS — K449 Diaphragmatic hernia without obstruction or gangrene: Secondary | ICD-10-CM | POA: Diagnosis not present

## 2021-10-10 DIAGNOSIS — D510 Vitamin B12 deficiency anemia due to intrinsic factor deficiency: Secondary | ICD-10-CM | POA: Diagnosis not present

## 2021-11-06 DIAGNOSIS — D519 Vitamin B12 deficiency anemia, unspecified: Secondary | ICD-10-CM | POA: Diagnosis not present

## 2021-11-14 ENCOUNTER — Other Ambulatory Visit: Payer: Self-pay

## 2021-11-14 MED ORDER — METOPROLOL SUCCINATE ER 50 MG PO TB24: 50.0000 mg | ORAL_TABLET | Freq: Every day | ORAL | 1 refills | Status: DC

## 2021-12-12 DIAGNOSIS — E538 Deficiency of other specified B group vitamins: Secondary | ICD-10-CM | POA: Diagnosis not present

## 2021-12-28 DIAGNOSIS — G720 Drug-induced myopathy: Secondary | ICD-10-CM | POA: Diagnosis not present

## 2021-12-28 DIAGNOSIS — G43109 Migraine with aura, not intractable, without status migrainosus: Secondary | ICD-10-CM | POA: Diagnosis not present

## 2021-12-28 DIAGNOSIS — I700 Atherosclerosis of aorta: Secondary | ICD-10-CM | POA: Diagnosis not present

## 2021-12-28 DIAGNOSIS — I7121 Aneurysm of the ascending aorta, without rupture: Secondary | ICD-10-CM | POA: Diagnosis not present

## 2022-01-09 DIAGNOSIS — E538 Deficiency of other specified B group vitamins: Secondary | ICD-10-CM | POA: Diagnosis not present

## 2022-02-06 DIAGNOSIS — E538 Deficiency of other specified B group vitamins: Secondary | ICD-10-CM | POA: Diagnosis not present

## 2022-02-08 ENCOUNTER — Ambulatory Visit: Payer: Medicare Other | Admitting: Cardiology

## 2022-02-15 ENCOUNTER — Ambulatory Visit (INDEPENDENT_AMBULATORY_CARE_PROVIDER_SITE_OTHER): Payer: Medicare Other | Admitting: Cardiology

## 2022-02-15 ENCOUNTER — Ambulatory Visit (INDEPENDENT_AMBULATORY_CARE_PROVIDER_SITE_OTHER): Payer: Medicare Other

## 2022-02-15 ENCOUNTER — Encounter: Payer: Self-pay | Admitting: Cardiology

## 2022-02-15 VITALS — BP 128/84 | HR 51 | Ht 68.0 in | Wt 203.8 lb

## 2022-02-15 DIAGNOSIS — I491 Atrial premature depolarization: Secondary | ICD-10-CM | POA: Diagnosis not present

## 2022-02-15 DIAGNOSIS — I471 Supraventricular tachycardia: Secondary | ICD-10-CM

## 2022-02-15 DIAGNOSIS — I10 Essential (primary) hypertension: Secondary | ICD-10-CM | POA: Diagnosis not present

## 2022-02-15 DIAGNOSIS — R55 Syncope and collapse: Secondary | ICD-10-CM | POA: Diagnosis not present

## 2022-02-15 DIAGNOSIS — I7121 Aneurysm of the ascending aorta, without rupture: Secondary | ICD-10-CM | POA: Diagnosis not present

## 2022-02-15 NOTE — Progress Notes (Signed)
Cardiology Office Note:    Date:  02/15/2022   ID:  Debra Harrison, DOB 04-04-1941, MRN [id number]  PCP:  Street, [name], MD  Cardiologist:  Gypsy Balsam, MD    Referring MD: Street, [name], *   Chief Complaint  Patient presents with   Shortness of Breath   Loss of Consciousness    History of Present Illness:    Debra Harrison is a 81 y.o. female with past medical history significant for supraventricular tachycardia, chronic kidney failure, B12 deficiency, status post left knee replacement surgery originally referred to [place] because of dizzy spells she wore monitor which shows some supraventricular tachycardia she was put on small dose of beta-blocker started feeling much better.  She was doing quite well however comes today for follow-up and she reports the fact that few days ago she passed out.  She woke up in the morning did not feel well she felt her heart speeding up.  Then she was sitting on the chair was trying to reach for blood pressure monitor and she momentarily blacked out.  She did not follow-up the chair she did not bite her tongue she did not wet herself.  Shortly after that she was perfectly fine.  She describes episodes of palpitations maybe 2 or 3 times a week also describes episode of dizziness 2 or 3 times a week.  Denies have any chest pain tightness squeezing pressure mid chest no swelling of lower extremities.  Past Medical History:  Diagnosis Date   Abnormal electrocardiogram (ECG) (EKG) 09/18/2017   Acid reflux    Adult-onset obesity 09/09/2017   Ankle edema 09/09/2017   APC (atrial premature contractions) 09/18/2017   Benign essential hypertension 09/09/2017   Benign paroxysmal vertigo 09/09/2017   Bilateral carotid bruits 08/28/2016   Borderline hyperglycemia 09/09/2017   Cervical radiculopathy 08/28/2016   Chest pain 09/09/2017   Chronic renal disease, stage III (HCC) 09/09/2017   Degenerative arthritis of hip    Dyslipidemia 09/09/2017   Dysphagia  09/09/2017   GERD (gastroesophageal reflux disease) 09/09/2017   Hypercholesterolemia    Hypertension    Idiopathic peripheral neuropathy 08/28/2016   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 Mardene Speak   TOTAL HIP ARTHROPLASTY Right 02/01/2008    Current Medications: Current Meds  Medication Sig   amLODipine (NORVASC) 5 MG tablet Take 1 tablet (5 mg total) by mouth daily.   furosemide (LASIX) 40 MG tablet Take 40 mg by mouth daily as needed for fluid or edema.   losartan (COZAAR) 100 MG tablet Take 100 mg by mouth daily.   metoprolol succinate (TOPROL-XL) 50 MG 24 hr tablet Take 1 tablet (50 mg total) by mouth daily. Take with or immediately following a meal.     Allergies:   Methocarbamol, Tetracyclines & related, Tetracycline, and Ace inhibitors   Social History   Socioeconomic History  Marital status: Widowed    Spouse name: Not on file   Number of children: Not on file   Years of education: Not on file   Highest education level: Not on file  Occupational History   Not on file  Tobacco Use   Smoking status: Former    Years: 15.00    Types: Cigarettes    Quit date: 27    Years since quitting: 36.6   Smokeless tobacco: Never  Vaping Use   Vaping Use: Never used  Substance and Sexual Activity   Alcohol use: Yes    Comment: occasionally, on a less than daily basis   Drug use: No    Sexual activity: Not on file  Other Topics Concern   Not on file  Social History Narrative   Not on file   Social Determinants of Health   Financial Resource Strain: Not on file  Food Insecurity: Not on file  Transportation Needs: Not on file  Physical Activity: Not on file  Stress: Not on file  Social Connections: Not on file     Family History: The patient's family history includes AAA (abdominal aortic aneurysm) in her brother; Colon cancer in her mother; Congestive Heart Failure in her mother; Diabetes in her father and mother; Heart disease in her brother and father; Heart failure in her father; Hypertension in her father and mother; Irregular heart beat in her brother; Leukemia in her brother; Thyroid disease in an other family member. ROS:   Please see the history of present illness.    All 14 point review of systems negative except as described per history of present illness  EKGs/Labs/Other Studies Reviewed:      Recent Labs: No results found for requested labs within last 365 days.  Recent Lipid Panel No results found for: "CHOL", "TRIG", "HDL", "CHOLHDL", "VLDL", "LDLCALC", "LDLDIRECT"  Physical Exam:    VS:  BP 128/84 (BP Location: Left Arm, Patient Position: Sitting)   Pulse (!) 51   Ht 5\' 8"  (1.727 m)   Wt 203 lb 12.8 oz (92.4 kg)   SpO2 93%   BMI 30.99 kg/m     Wt Readings from Last 3 Encounters:  02/15/22 203 lb 12.8 oz (92.4 kg)  08/09/21 208 lb 6.4 oz (94.5 kg)  06/13/21 205 lb (93 kg)     GEN:  Well nourished, well developed in no acute distress HEENT: Normal NECK: No JVD; No carotid bruits LYMPHATICS: No lymphadenopathy CARDIAC: RRR, no murmurs, no rubs, no gallops RESPIRATORY:  Clear to auscultation without rales, wheezing or rhonchi  ABDOMEN: Soft, non-tender, non-distended MUSCULOSKELETAL:  No edema; No deformity  SKIN: Warm and dry LOWER EXTREMITIES: no swelling NEUROLOGIC:  Alert and oriented x 3 PSYCHIATRIC:  Normal affect    ASSESSMENT:    1. Benign essential hypertension   2. APC (atrial premature contractions)   3. Primary hypertension   4. Supraventricular tachycardia ([place])   5. Aneurysm of ascending aorta without rupture ([place])   6. Near syncope    PLAN:    In order of problems listed above:  Benign essential hypertension blood pressure seems to be well controlled continue present management. Near syncope obviously concerning.  I will ask her to wear Zio patch for 2 weeks to see if this is related anyway in a fashion to her arrhythmia.  Future recommendations will be made based on results of those test.  I will also ask her to have an echocardiogram to assess left ventricle ejection fraction and look  at the aortic size. Supraventricular tachycardia.  She is on small dose of beta-blocker seems to be tolerating this quite well we will continue present management Essential hypertension blood pressure well controlled we will continue present management Dyslipidemia I do have her data from K PN showing total cholesterol 255 with HDL 53.  I do not have LDL.  We will call primary care physician to get more information about it   Medication Adjustments/Labs and Tests Ordered: Current medicines are reviewed at length with the patient today.  Concerns regarding medicines are outlined above.  No orders of the defined types were placed in this encounter.  Medication changes: No orders of the defined types were placed in this encounter.   Signed, Georgeanna Lea, MD, [facility] 02/15/2022 8:26 AM    [facility]

## 2022-02-15 NOTE — Addendum Note (Signed)
Addended by: Baldo Ash D on: 02/15/2022 08:44 AM   Modules accepted: Orders

## 2022-02-15 NOTE — Patient Instructions (Signed)
Medication Instructions:  Your physician recommends that you continue on your current medications as directed. Please refer to the Current Medication list given to you today.  *If you need a refill on your cardiac medications before your next appointment, please call your pharmacy*   Lab Work: None Ordered If you have labs (blood work) drawn today and your tests are completely normal, you will receive your results only by: MyChart Message (if you have MyChart) OR A paper copy in the mail If you have any lab test that is abnormal or we need to change your treatment, we will call you to review the results.   Testing/Procedures:  WHY IS MY DOCTOR PRESCRIBING ZIO? The Zio system is proven and trusted by physicians to detect and diagnose irregular heart rhythms -- and has been prescribed to hundreds of thousands of patients.  The FDA has cleared the Zio system to monitor for many different kinds of irregular heart rhythms. In a study, physicians were able to reach a diagnosis 90% of the time with the Zio system1.  You can wear the Zio monitor -- a small, discreet, comfortable patch -- during your normal day-to-day activity, including while you sleep, shower, and exercise, while it records every single heartbeat for analysis.  1Barrett, P., et al. Comparison of 24 Hour Holter Monitoring Versus 14 Day Novel Adhesive Patch Electrocardiographic Monitoring. American Journal of Medicine, 2014.  ZIO VS. HOLTER MONITORING The Zio monitor can be comfortably worn for up to 14 days. Holter monitors can be worn for 24 to 48 hours, limiting the time to record any irregular heart rhythms you may have. Zio is able to capture data for the 51% of patients who have their first symptom-triggered arrhythmia after 48 hours.1  LIVE WITHOUT RESTRICTIONS The Zio ambulatory cardiac monitor is a small, unobtrusive, and water-resistant patch--you might even forget you're wearing it. The Zio monitor records and stores  every beat of your heart, whether you're sleeping, working out, or showering.     Follow-Up: At [facility], you and your health needs are our priority.  As part of our continuing mission to provide you with exceptional heart care, we have created designated Provider Care Teams.  These Care Teams include your primary Cardiologist (physician) and Advanced Practice Providers (APPs -  Physician Assistants and Nurse Practitioners) who all work together to provide you with the care you need, when you need it.  We recommend signing up for the patient portal called "MyChart".  Sign up information is provided on this After Visit Summary.  MyChart is used to connect with patients for Virtual Visits (Telemedicine).  Patients are able to view lab/test results, encounter notes, upcoming appointments, etc.  Non-urgent messages can be sent to your provider as well.   To learn more about what you can do with MyChart, go to [email or web address].    Your next appointment:   2 month(s)  The format for your next appointment:   In Person  Provider:   Robert Krasowski, MD    Other Instructions NA  

## 2022-03-07 DIAGNOSIS — R55 Syncope and collapse: Secondary | ICD-10-CM | POA: Diagnosis not present

## 2022-03-12 DIAGNOSIS — D510 Vitamin B12 deficiency anemia due to intrinsic factor deficiency: Secondary | ICD-10-CM | POA: Diagnosis not present

## 2022-03-20 ENCOUNTER — Telehealth: Payer: Self-pay

## 2022-03-20 DIAGNOSIS — I472 Ventricular tachycardia, unspecified: Secondary | ICD-10-CM

## 2022-03-20 DIAGNOSIS — R0609 Other forms of dyspnea: Secondary | ICD-10-CM

## 2022-03-20 MED ORDER — METOPROLOL SUCCINATE ER 50 MG PO TB24: 75.0000 mg | ORAL_TABLET | Freq: Every day | ORAL | 0 refills | Status: DC

## 2022-03-20 NOTE — Telephone Encounter (Signed)
Patient notified of results and recommendations and agreed with plan. Rx sent, Echo order on file. She aware someone will call her to arrange appt.

## 2022-03-20 NOTE — Telephone Encounter (Signed)
-----   Message from Georgeanna Lea, MD sent at 03/19/2022  8:25 PM EDT ----- Monitor showed ventricular tachycardia, please increase dose of metoprolol succinate to 75 mg daily, schedule him to have echocardiogram to assess left ventricle ejection fraction

## 2022-04-03 ENCOUNTER — Ambulatory Visit: Payer: Medicare Other | Attending: Cardiology

## 2022-04-03 DIAGNOSIS — R0609 Other forms of dyspnea: Secondary | ICD-10-CM | POA: Diagnosis not present

## 2022-04-03 LAB — ECHOCARDIOGRAM COMPLETE
Area-P 1/2: 2.74 cm2
P 1/2 time: 422 msec
S' Lateral: 2.8 cm

## 2022-04-09 ENCOUNTER — Telehealth: Payer: Self-pay | Admitting: Cardiology

## 2022-04-09 MED ORDER — METOPROLOL SUCCINATE ER 50 MG PO TB24: 75.0000 mg | ORAL_TABLET | Freq: Every day | ORAL | 2 refills | Status: DC

## 2022-04-09 NOTE — Telephone Encounter (Signed)
*  STAT* If patient is at the pharmacy, call can be transferred to refill team.   1. Which medications need to be refilled? (please list name of each medication and dose if known)   metoprolol succinate (TOPROL-XL) 50 MG 24 hr tablet    2. Which pharmacy/location (including street and city if local pharmacy) is medication to be sent to?  [place], [place] Phone:  [phone]         3. Do they need a 30 day or 90 day supply?  90 day

## 2022-04-09 NOTE — Telephone Encounter (Signed)
Refill of Metoprolol Succinate 50 mg sent to [facility]. 

## 2022-04-10 DIAGNOSIS — D510 Vitamin B12 deficiency anemia due to intrinsic factor deficiency: Secondary | ICD-10-CM | POA: Diagnosis not present

## 2022-04-11 ENCOUNTER — Encounter: Payer: Self-pay | Admitting: Cardiology

## 2022-04-12 ENCOUNTER — Telehealth: Payer: Self-pay

## 2022-04-12 NOTE — Telephone Encounter (Signed)
Results sent and viewed in my chart per Dr. Wendy Poet note. Routed to PCP.

## 2022-05-11 ENCOUNTER — Telehealth: Payer: Self-pay | Admitting: *Deleted

## 2022-05-11 ENCOUNTER — Encounter: Payer: Self-pay | Admitting: Cardiology

## 2022-05-11 ENCOUNTER — Ambulatory Visit: Payer: Medicare Other | Attending: Cardiology | Admitting: Cardiology

## 2022-05-11 VITALS — BP 138/84 | HR 73 | Ht 68.0 in | Wt 204.6 lb

## 2022-05-11 DIAGNOSIS — I10 Essential (primary) hypertension: Secondary | ICD-10-CM | POA: Diagnosis not present

## 2022-05-11 DIAGNOSIS — R0683 Snoring: Secondary | ICD-10-CM

## 2022-05-11 DIAGNOSIS — I471 Supraventricular tachycardia, unspecified: Secondary | ICD-10-CM | POA: Diagnosis not present

## 2022-05-11 DIAGNOSIS — E785 Hyperlipidemia, unspecified: Secondary | ICD-10-CM | POA: Insufficient documentation

## 2022-05-11 DIAGNOSIS — I7121 Aneurysm of the ascending aorta, without rupture: Secondary | ICD-10-CM | POA: Insufficient documentation

## 2022-05-11 DIAGNOSIS — I472 Ventricular tachycardia, unspecified: Secondary | ICD-10-CM | POA: Diagnosis not present

## 2022-05-11 DIAGNOSIS — Z23 Encounter for immunization: Secondary | ICD-10-CM | POA: Diagnosis not present

## 2022-05-11 DIAGNOSIS — E538 Deficiency of other specified B group vitamins: Secondary | ICD-10-CM | POA: Diagnosis not present

## 2022-05-11 MED ORDER — AMLODIPINE BESYLATE 5 MG PO TABS: 5.0000 mg | ORAL_TABLET | Freq: Every day | ORAL | 3 refills | Status: DC

## 2022-05-11 NOTE — Progress Notes (Signed)
Cardiology Office Note:    Date:  05/11/2022   ID:  Debra Harrison, DOB 1940-10-25, MRN [id number]  PCP:  Street, Sharon Mt, MD  Cardiologist:  Jenne Campus, MD    Referring MD: Street, Sharon Mt, *   Chief Complaint  Patient presents with   Results  Doing well  History of Present Illness:    Debra Harrison is a 81 y.o. female with past medical history significant for supraventricular tachycardia, recently discovered ventricular tachycardia, chronic kidney failure, B12 deficiency, essential hypertension, snoring.  She was sent to [place] because of episode of dizziness.  Recently she had 1 episode of passing out.  After that she got monitoring placed monitor shows supraventricular tachycardia as well as 1 short run of supraventricular tachycardia.  It was completely asymptomatic.  As a follow-up she did have echocardiogram done to assess left ventricle ejection fraction which was normal.  Since that time she is doing well.  She described the fact that she falls asleep very easily watching TV.  After discovering of her ventricular tachycardia her beta-blocker has been increased and since that time she is doing well  Past Medical History:  Diagnosis Date   Abnormal electrocardiogram (ECG) (EKG) 09/18/2017   Acid reflux    Adult-onset obesity 09/09/2017   Ankle edema 09/09/2017   APC (atrial premature contractions) 09/18/2017   Benign essential hypertension 09/09/2017   Benign paroxysmal vertigo 09/09/2017   Bilateral carotid bruits 08/28/2016   Borderline hyperglycemia 09/09/2017   Cervical radiculopathy 08/28/2016   Chest pain 09/09/2017   Chronic renal disease, stage III ([place]) 09/09/2017   Degenerative arthritis of hip    Dyslipidemia 09/09/2017   Dysphagia 09/09/2017   GERD (gastroesophageal reflux disease) 09/09/2017   Hypercholesterolemia    Hypertension    Idiopathic peripheral neuropathy 08/28/2016   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   Irregular heart rate 09/09/2017    Localized osteoarthrosis, lower leg 09/09/2017   Lower limb length difference 03/12/2017   Osteoarthritis    Pernicious anemia 09/09/2017   Second degree heart block 09/09/2017   Spinal cord mass ([place]) 08/28/2016   Thyroid nodule 09/11/2016   Noted on carotid dopplers through neurology 08/30/2016   Total knee replacement status, left    Vitamin B 12 deficiency 09/09/2017   Wrist fracture 05/2001    Past Surgical History:  Procedure Laterality Date   ABDOMINAL HYSTERECTOMY     KNEE ARTHROSCOPY Right 05/2007   left total knee arthroscopy  05/30/2016   Dr. Adin Hector   TOTAL HIP ARTHROPLASTY Right 02/01/2008    Current Medications: Current Meds  Medication Sig   amLODipine (NORVASC) 5 MG tablet Take 1 tablet (5 mg total) by mouth daily.   furosemide (LASIX) 40 MG tablet Take 40 mg by mouth daily as needed for fluid or edema.   losartan (COZAAR) 100 MG tablet Take 100 mg by mouth daily.   metoprolol succinate (TOPROL-XL) 50 MG 24 hr tablet Take 1.5 tablets (75 mg total) by mouth daily. Take with or immediately following a meal.   [DISCONTINUED] amLODipine (NORVASC) 5 MG tablet Take 1 tablet (5 mg total) by mouth daily.     Allergies:   Methocarbamol, Tetracyclines & related, Tetracycline, and Ace inhibitors   Social History   Socioeconomic History   Marital status: Widowed    Spouse name: Not on file   Number of children: Not on file   Years of education: Not on file   Highest education level: Not on file  Occupational  History   Not on file  Tobacco Use   Smoking status: Former    Years: 15.00    Types: Cigarettes    Quit date: 1987    Years since quitting: 36.8   Smokeless tobacco: Never  Vaping Use   Vaping Use: Never used  Substance and Sexual Activity   Alcohol use: Yes    Comment: occasionally, on a less than daily basis   Drug use: No   Sexual activity: Not on file  Other Topics Concern   Not on file  Social History Narrative   Not on file   Social Determinants  of Health   Financial Resource Strain: Not on file  Food Insecurity: Not on file  Transportation Needs: Not on file  Physical Activity: Not on file  Stress: Not on file  Social Connections: Not on file     Family History: The patient's family history includes AAA (abdominal aortic aneurysm) in her brother; Colon cancer in her mother; Congestive Heart Failure in her mother; Diabetes in her father and mother; Heart disease in her brother and father; Heart failure in her father; Hypertension in her father and mother; Irregular heart beat in her brother; Leukemia in her brother; Thyroid disease in an other family member. ROS:   Please see the history of present illness.    All 14 point review of systems negative except as described per history of present illness  EKGs/Labs/Other Studies Reviewed:      Recent Labs: No results found for requested labs within last 365 days.  Recent Lipid Panel No results found for: "CHOL", "TRIG", "HDL", "CHOLHDL", "VLDL", "LDLCALC", "LDLDIRECT"  Physical Exam:    VS:  BP 138/84 (BP Location: Left Arm, Patient Position: Sitting)   Pulse 73   Ht 5\' 8"  (1.727 m)   Wt 204 lb 9.6 oz (92.8 kg)   SpO2 96%   BMI 31.11 kg/m     Wt Readings from Last 3 Encounters:  05/11/22 204 lb 9.6 oz (92.8 kg)  02/15/22 203 lb 12.8 oz (92.4 kg)  08/09/21 208 lb 6.4 oz (94.5 kg)     GEN:  Well nourished, well developed in no acute distress HEENT: Normal NECK: No JVD; No carotid bruits LYMPHATICS: No lymphadenopathy CARDIAC: RRR, no murmurs, no rubs, no gallops RESPIRATORY:  Clear to auscultation without rales, wheezing or rhonchi  ABDOMEN: Soft, non-tender, non-distended MUSCULOSKELETAL:  No edema; No deformity  SKIN: Warm and dry LOWER EXTREMITIES: no swelling NEUROLOGIC:  Alert and oriented x 3 PSYCHIATRIC:  Normal affect   ASSESSMENT:    1. Supraventricular tachycardia   2. Ventricular tachycardia ([place])   3. Benign essential hypertension   4. Aneurysm  of ascending aorta without rupture (HCC)    PLAN:    In order of problems listed above:  Supraventricular tachycardia.  Still present on monitor however beta-blocker has been increased.  She is doing well Ventricular tachycardia: Stress test last year negative, echocardiogram done showed preserved left ventricle ejection fraction, beta-blocker has been increased.  We will continue monitoring Benign essential hypertension blood pressure seems to well controlled continue present management. Excessive daytime somnolence with some snoring.  I will ask her to have a sleep study done Ascending aortic aneurysm measuring 44 mm based on last study echocardiogram. Lipidemia I did review K PN which showed total cholesterol 255 HDL 53.  We will ask her to have another fasting lipid profile done however, she told me she tried some statin with difficulties and she does not  want to take statin but she is open for different medications   Medication Adjustments/Labs and Tests Ordered: Current medicines are reviewed at length with the patient today.  Concerns regarding medicines are outlined above.  No orders of the defined types were placed in this encounter.  Medication changes:  Meds ordered this encounter  Medications   amLODipine (NORVASC) 5 MG tablet    Sig: Take 1 tablet (5 mg total) by mouth daily.    Dispense:  90 tablet    Refill:  3    Signed, Park Liter, MD, [facility] 05/11/2022 8:29 AM    [facility]

## 2022-05-11 NOTE — Patient Instructions (Signed)
Medication Instructions:  Your physician recommends that you continue on your current medications as directed. Please refer to the Current Medication list given to you today.  *If you need a refill on your cardiac medications before your next appointment, please call your pharmacy*   Lab Work: Lipid- today If you have labs (blood work) drawn today and your tests are completely normal, you will receive your results only by: [place] (if you have MyChart) OR A paper copy in the mail If you have any lab test that is abnormal or we need to change your treatment, we will call you to review the results.   Testing/Procedures: None Ordered   Follow-Up: At [facility], you and your health needs are our priority.  As part of our continuing mission to provide you with exceptional heart care, we have created designated Provider Care Teams.  These Care Teams include your primary Cardiologist (physician) and Advanced Practice Providers (APPs -  Physician Assistants and Nurse Practitioners) who all work together to provide you with the care you need, when you need it.  We recommend signing up for the patient portal called "MyChart".  Sign up information is provided on this After Visit Summary.  MyChart is used to connect with patients for Virtual Visits (Telemedicine).  Patients are able to view lab/test results, encounter notes, upcoming appointments, etc.  Non-urgent messages can be sent to your provider as well.   To learn more about what you can do with MyChart, go to [email or web address].    Your next appointment:   6 month(s)  The format for your next appointment:   In Person  Provider:   Jenne Campus, MD    Other Instructions Itamar sleep study- Will call when authorized by insurance to come pick up

## 2022-05-11 NOTE — Telephone Encounter (Signed)
Notified Lisa Harris ok to activate itamar device.  

## 2022-05-11 NOTE — Addendum Note (Signed)
Addended by: Jacobo Forest D on: 05/11/2022 08:43 AM   Modules accepted: Orders

## 2022-05-12 DIAGNOSIS — I10 Essential (primary) hypertension: Secondary | ICD-10-CM | POA: Diagnosis not present

## 2022-05-12 DIAGNOSIS — Z79899 Other long term (current) drug therapy: Secondary | ICD-10-CM | POA: Diagnosis not present

## 2022-05-12 DIAGNOSIS — R55 Syncope and collapse: Secondary | ICD-10-CM | POA: Diagnosis not present

## 2022-05-12 DIAGNOSIS — R002 Palpitations: Secondary | ICD-10-CM | POA: Diagnosis not present

## 2022-05-12 DIAGNOSIS — I700 Atherosclerosis of aorta: Secondary | ICD-10-CM | POA: Diagnosis not present

## 2022-05-12 DIAGNOSIS — R9431 Abnormal electrocardiogram [ECG] [EKG]: Secondary | ICD-10-CM | POA: Diagnosis not present

## 2022-05-12 DIAGNOSIS — I4891 Unspecified atrial fibrillation: Secondary | ICD-10-CM | POA: Diagnosis not present

## 2022-05-12 DIAGNOSIS — K219 Gastro-esophageal reflux disease without esophagitis: Secondary | ICD-10-CM | POA: Diagnosis not present

## 2022-05-12 DIAGNOSIS — Z87891 Personal history of nicotine dependence: Secondary | ICD-10-CM | POA: Diagnosis not present

## 2022-05-12 LAB — LIPID PANEL
Chol/HDL Ratio: 4.1 ratio (ref 0.0–4.4)
Cholesterol, Total: 219 mg/dL — ABNORMAL HIGH (ref 100–199)
HDL: 53 mg/dL (ref >39)
LDL Chol Calc (NIH): 146 mg/dL — ABNORMAL HIGH (ref 0–99)
Triglycerides: 110 mg/dL (ref 0–149)
VLDL Cholesterol Cal: 20 mg/dL (ref 5–40)

## 2022-05-13 DIAGNOSIS — Z79899 Other long term (current) drug therapy: Secondary | ICD-10-CM | POA: Diagnosis not present

## 2022-05-13 DIAGNOSIS — I10 Essential (primary) hypertension: Secondary | ICD-10-CM | POA: Diagnosis not present

## 2022-05-13 DIAGNOSIS — R002 Palpitations: Secondary | ICD-10-CM | POA: Diagnosis not present

## 2022-05-13 DIAGNOSIS — R55 Syncope and collapse: Secondary | ICD-10-CM | POA: Diagnosis not present

## 2022-05-13 DIAGNOSIS — I472 Ventricular tachycardia, unspecified: Secondary | ICD-10-CM | POA: Diagnosis not present

## 2022-05-13 DIAGNOSIS — I4719 Other supraventricular tachycardia: Secondary | ICD-10-CM | POA: Diagnosis not present

## 2022-05-17 ENCOUNTER — Telehealth: Payer: Self-pay

## 2022-05-17 ENCOUNTER — Other Ambulatory Visit: Payer: Self-pay

## 2022-05-17 DIAGNOSIS — I471 Supraventricular tachycardia, unspecified: Secondary | ICD-10-CM

## 2022-05-17 MED ORDER — EZETIMIBE 10 MG PO TABS: 10.0000 mg | ORAL_TABLET | Freq: Every day | ORAL | 3 refills | Status: DC

## 2022-05-17 NOTE — Telephone Encounter (Signed)
Results reviewed with pt as per Dr. Wendy Poet note.  Pt verbalized understanding and had no additional questions. Pt agreed to start Zetia and to follow up in 6 weeks for blood work. Routed to PCP

## 2022-05-17 NOTE — Telephone Encounter (Signed)
Patient aware to come by on Monday at [place] for 14 day Zio per [place].

## 2022-05-17 NOTE — Telephone Encounter (Signed)
Patient aware ok to pick up sleep study approve. Must sign consent.

## 2022-05-21 ENCOUNTER — Ambulatory Visit: Payer: Medicare Other

## 2022-05-21 ENCOUNTER — Ambulatory Visit: Payer: Medicare Other | Attending: Cardiology

## 2022-05-21 DIAGNOSIS — I471 Supraventricular tachycardia, unspecified: Secondary | ICD-10-CM | POA: Diagnosis not present

## 2022-05-23 ENCOUNTER — Telehealth: Payer: Self-pay | Admitting: Cardiology

## 2022-05-23 ENCOUNTER — Encounter (HOSPITAL_BASED_OUTPATIENT_CLINIC_OR_DEPARTMENT_OTHER): Payer: Medicare Other | Admitting: Cardiology

## 2022-05-23 DIAGNOSIS — G4733 Obstructive sleep apnea (adult) (pediatric): Secondary | ICD-10-CM | POA: Diagnosis not present

## 2022-05-23 DIAGNOSIS — T466X5A Adverse effect of antihyperlipidemic and antiarteriosclerotic drugs, initial encounter: Secondary | ICD-10-CM | POA: Diagnosis not present

## 2022-05-23 DIAGNOSIS — I10 Essential (primary) hypertension: Secondary | ICD-10-CM | POA: Diagnosis not present

## 2022-05-23 DIAGNOSIS — E785 Hyperlipidemia, unspecified: Secondary | ICD-10-CM | POA: Diagnosis not present

## 2022-05-23 DIAGNOSIS — G720 Drug-induced myopathy: Secondary | ICD-10-CM | POA: Diagnosis not present

## 2022-05-23 NOTE — Telephone Encounter (Signed)
Pt said per itamar tech her sleep study is not yet registered

## 2022-05-23 NOTE — Telephone Encounter (Signed)
Debra Harrison registered. Pt notified.

## 2022-05-24 ENCOUNTER — Ambulatory Visit: Payer: Medicare Other | Attending: Cardiology

## 2022-05-24 DIAGNOSIS — R0683 Snoring: Secondary | ICD-10-CM

## 2022-05-24 NOTE — Procedures (Signed)
SLEEP STUDY REPORT Patient Information Study Date: 05/23/22 Patient Name: Debra Harrison Patient ID: [id number] Birth Date: 03/11/2041 Age: 81 Gender: Female BMI: 31.1 (W=205 lb, H=5' 8'') Referring Physician: Gypsy Balsam, MD  TEST DESCRIPTION:  Home sleep apnea testing was completed using the WatchPat, a Type 1 device, utilizing peripheral arterial tonometry (PAT), chest movement, actigraphy, pulse oximetry, pulse rate, body position and snore.  AHI was calculated with apnea and hypopnea using valid sleep time as the denominator. RDI includes apneas, hypopneas, and RERAs.  The data acquired and the scoring of sleep and all associated events were performed in accordance with the recommended standards and specifications as outlined in the AASM Manual for the Scoring of Sleep and Associated Events 2.2.0 (2015).  FINDINGS:  1.  Moderate Obstructive Sleep Apnea with AHI 24.2/hr.   2.  No Central Sleep Apnea with pAHIc 0.4/hr.  3.  Oxygen desaturations as low as 80%.  4.  Severe snoring was present. O2 sats were < 88% for 15.3 min.  5.  Total sleep time was 5 hrs and 24 min.  6.  36.5% of total sleep time was spent in REM sleep.   7.  Prolonged sleep onset latency at 30 min  8.  Shortened  REM sleep onset latency at 73 min.   9.  Total awakenings were 6 .  10. Arrhythmia detection:  Suggestive of possible brief atrial fibrillation lasting 3 min and 58 sec seconds.  This is not diagnostic and further testing with outpatient telemetry monitoring is recommended.  DIAGNOSIS:   Moderate Obstructive Sleep Apnea (G47.33) Nocturnal Hypoxemia Possible non sustained atrial arrhythmias  RECOMMENDATIONS:   1.  Clinical correlation of these findings is necessary.  The decision to treat obstructive sleep apnea (OSA) is usually based on the presence of apnea symptoms or the presence of associated medical conditions such as Hypertension, Congestive Heart Failure, Atrial Fibrillation or  Obesity.  The most common symptoms of OSA are snoring, gasping for breath while sleeping, daytime sleepiness and fatigue.   2.  Initiating apnea therapy is recommended given the presence of symptoms and/or associated conditions. Recommend proceeding with one of the following:     a.  Auto-CPAP therapy with a pressure range of 5-20cm H2O.     b.  An oral appliance (OA) that can be obtained from certain dentists with expertise in sleep medicine.  These are primarily of use in non-obese patients with mild and moderate disease.     c.  An ENT consultation which may be useful to look for specific causes of obstruction and possible treatment options.     d.  If patient is intolerant to PAP therapy, consider referral to ENT for evaluation for hypoglossal nerve stimulator.   3.  Close follow-up is necessary to ensure success with CPAP or oral appliance therapy for maximum benefit.  4.  A follow-up oximetry study on CPAP is recommended to assess the adequacy of therapy and determine the need for supplemental oxygen or the potential need for Bi-level therapy.  An arterial blood gas to determine the adequacy of baseline ventilation and oxygenation should also be considered.  5.  Healthy sleep recommendations include:  adequate nightly sleep (normal 7-9 hrs/night), avoidance of caffeine after noon and alcohol near bedtime, and maintaining a sleep environment that is cool, dark and quiet.  6.  Weight loss for overweight patients is recommended.  Even modest amounts of weight loss can significantly improve the severity of sleep apnea.  7.  Snoring recommendations include:  weight loss where appropriate, side sleeping, and avoidance of alcohol before bed.  8.  Operation of motor vehicle should not be performed when sleepy.  9.  Consider outpatient event monitor to assess for silent atrial arrhythmias if clinically indicated.  Signature:   Armanda Magic, MD; [facility]; Diplomat, American Board of Sleep  Medicine Electronically Signed: 05/24/22

## 2022-05-28 ENCOUNTER — Telehealth: Payer: Self-pay | Admitting: *Deleted

## 2022-05-28 NOTE — Telephone Encounter (Signed)
Patient notified of sleep study results and recommendations. She agrees to proceed with CPAP treatment pending insurance approval. APAP order sent to [facility] in [place].

## 2022-06-11 ENCOUNTER — Encounter: Payer: Self-pay | Admitting: Cardiology

## 2022-06-11 DIAGNOSIS — I7121 Aneurysm of the ascending aorta, without rupture: Secondary | ICD-10-CM

## 2022-06-11 DIAGNOSIS — E785 Hyperlipidemia, unspecified: Secondary | ICD-10-CM

## 2022-06-11 DIAGNOSIS — Z789 Other specified health status: Secondary | ICD-10-CM

## 2022-06-11 DIAGNOSIS — E7800 Pure hypercholesterolemia, unspecified: Secondary | ICD-10-CM

## 2022-06-11 MED ORDER — NEXLETOL 180 MG PO TABS: 1.0000 | ORAL_TABLET | Freq: Every day | ORAL | 3 refills | Status: DC

## 2022-06-11 NOTE — Telephone Encounter (Signed)
Spoke with pt and advised that I was sending the RX to [facility] as they work to get [employer] approved. Pt states that she has taken several statins and is unable to continue them due to the increase muscle pain and swelling. Pt will look for names of medications she has tried from her PCP and let us know.

## 2022-06-12 DIAGNOSIS — D510 Vitamin B12 deficiency anemia due to intrinsic factor deficiency: Secondary | ICD-10-CM | POA: Diagnosis not present

## 2022-06-13 DIAGNOSIS — I471 Supraventricular tachycardia, unspecified: Secondary | ICD-10-CM | POA: Diagnosis not present

## 2022-06-28 ENCOUNTER — Telehealth: Payer: Self-pay

## 2022-06-28 ENCOUNTER — Telehealth: Payer: Self-pay | Admitting: Cardiology

## 2022-06-28 DIAGNOSIS — I491 Atrial premature depolarization: Secondary | ICD-10-CM

## 2022-06-28 DIAGNOSIS — I471 Supraventricular tachycardia, unspecified: Secondary | ICD-10-CM

## 2022-06-28 DIAGNOSIS — Z79899 Other long term (current) drug therapy: Secondary | ICD-10-CM

## 2022-06-28 NOTE — Telephone Encounter (Signed)
Patient is returning call for monitor results. 

## 2022-06-28 NOTE — Telephone Encounter (Signed)
Patient notified of the following per Dr. Bing Matter. Will stop by tomorrow for labs. Order on file.

## 2022-06-28 NOTE — Telephone Encounter (Signed)
Patient informed of results.  

## 2022-06-28 NOTE — Telephone Encounter (Signed)
-----   Message from Georgeanna Lea, MD sent at 06/28/2022  3:38 PM EST ----- Monitor shows surprisingly atrial fibrillation.  Please check CBC stool for guaiac Chem-7 so we can initiate anticoagulation

## 2022-06-29 DIAGNOSIS — Z79899 Other long term (current) drug therapy: Secondary | ICD-10-CM | POA: Diagnosis not present

## 2022-07-02 DIAGNOSIS — Z79899 Other long term (current) drug therapy: Secondary | ICD-10-CM | POA: Diagnosis not present

## 2022-07-02 LAB — CBC
Hematocrit: 42.6 % (ref 34.0–46.6)
Hemoglobin: 13.5 g/dL (ref 11.1–15.9)
MCH: 29.9 pg (ref 26.6–33.0)
MCHC: 31.7 g/dL (ref 31.5–35.7)
MCV: 95 fL (ref 79–97)
Platelets: 280 10*3/uL (ref 150–450)
RBC: 4.51 x10E6/uL (ref 3.77–5.28)
RDW: 13.2 % (ref 11.7–15.4)
WBC: 6.2 10*3/uL (ref 3.4–10.8)

## 2022-07-02 LAB — BASIC METABOLIC PANEL
BUN/Creatinine Ratio: 19 (ref 12–28)
BUN: 16 mg/dL (ref 8–27)
CO2: 25 mmol/L (ref 20–29)
Calcium: 10.1 mg/dL (ref 8.7–10.3)
Chloride: 100 mmol/L (ref 96–106)
Creatinine, Ser: 0.84 mg/dL (ref 0.57–1.00)
Glucose: 99 mg/dL (ref 70–99)
Potassium: 4.4 mmol/L (ref 3.5–5.2)
Sodium: 140 mmol/L (ref 134–144)
eGFR: 69 mL/min/{1.73_m2} (ref >59)

## 2022-07-03 LAB — FECAL OCCULT BLOOD, IMMUNOCHEMICAL: Fecal Occult Bld: POSITIVE — AB

## 2022-07-05 NOTE — Telephone Encounter (Signed)
Spoke with pt about labs and stool. Per Dr. Bing Matter her stool is positive for blood and she should follow up with PCP before initiating anticoagulation. Pt agreed and verbalized understanding and had no further questions.

## 2022-07-11 DIAGNOSIS — E538 Deficiency of other specified B group vitamins: Secondary | ICD-10-CM | POA: Diagnosis not present

## 2022-07-22 DIAGNOSIS — I10 Essential (primary) hypertension: Secondary | ICD-10-CM | POA: Diagnosis present

## 2022-07-22 DIAGNOSIS — E7800 Pure hypercholesterolemia, unspecified: Secondary | ICD-10-CM | POA: Diagnosis present

## 2022-07-22 DIAGNOSIS — Z886 Allergy status to analgesic agent status: Secondary | ICD-10-CM | POA: Diagnosis not present

## 2022-07-22 DIAGNOSIS — M1990 Unspecified osteoarthritis, unspecified site: Secondary | ICD-10-CM | POA: Diagnosis present

## 2022-07-22 DIAGNOSIS — D649 Anemia, unspecified: Secondary | ICD-10-CM | POA: Diagnosis present

## 2022-07-22 DIAGNOSIS — Z8719 Personal history of other diseases of the digestive system: Secondary | ICD-10-CM | POA: Diagnosis not present

## 2022-07-22 DIAGNOSIS — I4892 Unspecified atrial flutter: Secondary | ICD-10-CM | POA: Diagnosis present

## 2022-07-22 DIAGNOSIS — I4719 Other supraventricular tachycardia: Secondary | ICD-10-CM | POA: Diagnosis present

## 2022-07-22 DIAGNOSIS — Z79899 Other long term (current) drug therapy: Secondary | ICD-10-CM | POA: Diagnosis not present

## 2022-07-22 DIAGNOSIS — R000 Tachycardia, unspecified: Secondary | ICD-10-CM | POA: Diagnosis not present

## 2022-07-22 DIAGNOSIS — R002 Palpitations: Secondary | ICD-10-CM | POA: Diagnosis not present

## 2022-07-22 DIAGNOSIS — R9431 Abnormal electrocardiogram [ECG] [EKG]: Secondary | ICD-10-CM | POA: Diagnosis not present

## 2022-07-22 DIAGNOSIS — Z881 Allergy status to other antibiotic agents status: Secondary | ICD-10-CM | POA: Diagnosis not present

## 2022-07-22 DIAGNOSIS — Z888 Allergy status to other drugs, medicaments and biological substances status: Secondary | ICD-10-CM | POA: Diagnosis not present

## 2022-07-22 DIAGNOSIS — Z87891 Personal history of nicotine dependence: Secondary | ICD-10-CM | POA: Diagnosis not present

## 2022-08-02 DIAGNOSIS — E538 Deficiency of other specified B group vitamins: Secondary | ICD-10-CM | POA: Diagnosis not present

## 2022-08-02 DIAGNOSIS — I10 Essential (primary) hypertension: Secondary | ICD-10-CM | POA: Diagnosis not present

## 2022-08-02 DIAGNOSIS — E785 Hyperlipidemia, unspecified: Secondary | ICD-10-CM | POA: Diagnosis not present

## 2022-08-02 DIAGNOSIS — I4892 Unspecified atrial flutter: Secondary | ICD-10-CM | POA: Diagnosis not present

## 2022-08-02 DIAGNOSIS — Z0000 Encounter for general adult medical examination without abnormal findings: Secondary | ICD-10-CM | POA: Diagnosis not present

## 2022-08-02 DIAGNOSIS — Z79899 Other long term (current) drug therapy: Secondary | ICD-10-CM | POA: Diagnosis not present

## 2022-08-02 DIAGNOSIS — R7302 Impaired glucose tolerance (oral): Secondary | ICD-10-CM | POA: Diagnosis not present

## 2022-08-02 DIAGNOSIS — R195 Other fecal abnormalities: Secondary | ICD-10-CM | POA: Diagnosis not present

## 2022-08-02 LAB — LAB REPORT - SCANNED: A1c: 5.6

## 2022-08-06 ENCOUNTER — Encounter: Payer: Self-pay | Admitting: Cardiology

## 2022-08-06 ENCOUNTER — Ambulatory Visit: Payer: Medicare Other | Attending: Cardiology | Admitting: Cardiology

## 2022-08-06 VITALS — BP 140/72 | HR 53 | Ht 68.0 in | Wt 203.6 lb

## 2022-08-06 DIAGNOSIS — G4733 Obstructive sleep apnea (adult) (pediatric): Secondary | ICD-10-CM | POA: Insufficient documentation

## 2022-08-06 DIAGNOSIS — I480 Paroxysmal atrial fibrillation: Secondary | ICD-10-CM | POA: Diagnosis not present

## 2022-08-06 DIAGNOSIS — D6869 Other thrombophilia: Secondary | ICD-10-CM | POA: Insufficient documentation

## 2022-08-06 DIAGNOSIS — I483 Typical atrial flutter: Secondary | ICD-10-CM | POA: Insufficient documentation

## 2022-08-06 MED ORDER — AMIODARONE HCL 200 MG PO TABS: ORAL_TABLET | ORAL | 5 refills | Status: DC

## 2022-08-06 NOTE — Patient Instructions (Addendum)
Medication Instructions:  Your physician has recommended you make the following change in your medication:  START Amiodarone      - take 1 tablet (200 mg total) TWICE daily for one month, then      - take 1 tablet (200 mg total) ONCE daily thereafter  *If you need a refill on your cardiac medications before your next appointment, please call your pharmacy*   Lab Work: None ordered If you have labs (blood work) drawn today and your tests are completely normal, you will receive your results only by: [place] (if you have MyChart) OR A paper copy in the mail If you have any lab test that is abnormal or we need to change your treatment, we will call you to review the results.   Testing/Procedures: None ordered   Follow-Up: At [facility], you and your health needs are our priority.  As part of our continuing mission to provide you with exceptional heart care, we have created designated Provider Care Teams.  These Care Teams include your primary Cardiologist (physician) and Advanced Practice Providers (APPs -  Physician Assistants and Nurse Practitioners) who all work together to provide you with the care you need, when you need it.  Your next appointment:   6 month(s)  The format for your next appointment:   In Person  Provider:   Allegra Lai, MD    Thank you for choosing [facility]!!   Trinidad Curet, RN [phone]   Other Instructions   Once you have seen GI -- please let us know   Amiodarone Tablets What is this medication? AMIODARONE (a MEE oh da rone) prevents and treats a fast or irregular heartbeat (arrhythmia). It works by slowing down overactive electric signals in the heart, which stabilizes your heart rhythm. It belongs to a group of medications called antiarrhythmics. This medicine may be used for other purposes; ask your health care provider or pharmacist if you have questions. COMMON BRAND NAME(S): Cordarone, Pacerone What should I tell  my care team before I take this medication? They need to know if you have any of these conditions: Liver disease Lung disease Other heart problems Thyroid disease An unusual or allergic reaction to amiodarone, iodine, other medications, foods, dyes, or preservatives Pregnant or trying to get pregnant Breast-feeding How should I use this medication? Take this medication by mouth with water. Take it as directed on the prescription label at the same time every day. You can take it with or without food. You should always take it the same way. Keep taking it unless your care team tells you to stop. A special MedGuide will be given to you by the pharmacist with each prescription and refill. Be sure to read this information carefully each time. Talk to your care team about the use of this medication in children. Special care may be needed. Overdosage: If you think you have taken too much of this medicine contact a poison control center or emergency room at once. NOTE: This medicine is only for you. Do not share this medicine with others. What if I miss a dose? If you miss a dose, take it as soon as you can. If it is almost time for your next dose, take only that dose. Do not take double or extra doses. What may interact with this medication? Do not take this medication with any of the following: Abarelix Apomorphine Arsenic trioxide Certain antibiotics, such as erythromycin, gemifloxacin, levofloxacin, or pentamidine Certain medications for depression, such as  amoxapine or tricyclic antidepressants Certain medications for fungal infections, such as fluconazole, itraconazole, ketoconazole, posaconazole, or voriconazole Certain medications for irregular heartbeat, such as disopyramide, dronedarone, ibutilide, propafenone, or sotalol Certain medications for malaria, such as chloroquine or halofantrine Cisapride Droperidol Haloperidol [place] Maprotiline Methadone Phenothiazines, such as  chlorpromazine, mesoridazine, or thioridazine Pimozide Ranolazine Red yeast rice Vardenafil This medication may also interact with the following: Antivirals for HIV Certain medications for blood pressure, heart disease, irregular heartbeat Certain medications for cholesterol, such as atorvastatin, cerivastatin, lovastatin, or simvastatin Certain medications for hepatitis C, such as sofosbuvir and ledipasvir; sofosbuvir Certain medications for seizures, such as phenytoin Certain medications for thyroid problems Certain medications that prevent or treat blood clots, such as warfarin Cholestyramine Cimetidine Clopidogrel Cyclosporine Dextromethorphan Diuretics Dofetilide Fentanyl General anesthetics Grapefruit juice Lidocaine Loratadine Methotrexate Other medications that cause heart rhythm changes Procainamide Quinidine Rifabutin, rifampin, or rifapentine St. John's Wort Trazodone Ziprasidone This list may not describe all possible interactions. Give your health care provider a list of all the medicines, herbs, non-prescription drugs, or dietary supplements you use. Also tell them if you smoke, drink alcohol, or use illegal drugs. Some items may interact with your medicine. What should I watch for while using this medication? Your condition will be monitored closely when you first begin therapy. This medication is often started in a hospital or other monitored health care setting. Once you are on maintenance therapy, visit your care team for regular checks on your progress. Because your condition and use of this medication carry some risk, it is a good idea to carry an identification card, necklace, or bracelet with details of your condition, medications, and care team. This medication may affect your coordination, reaction time, or judgment. Do not drive or operate machinery until you know how this medication affects you. Sit up or stand slowly to reduce the risk of dizzy or  fainting spells. Drinking alcohol with this medication can increase the risk of these side effects. This medication can make you more sensitive to the sun. Keep out of the sun. If you cannot avoid being in the sun, wear protective clothing and sunscreen. Do not use sun lamps, tanning beds, or tanning booths. You should have regular eye exams before and during treatment. Call your care team if you have blurred vision, see halos, or your eyes become sensitive to light. Your eyes may get dry. It may be helpful to use a lubricating eye solution or artificial tears solution. If you are going to have surgery or a procedure that requires contrast dyes, tell your care team that you are taking this medication. What side effects may I notice from receiving this medication? Side effects that you should report to your care team as soon as possible: Allergic reactions--skin rash, itching, hives, swelling of the face, lips, tongue, or throat Bluish-gray skin Change in vision such as blurry vision, seeing halos around lights, vision loss Heart failure--shortness of breath, swelling of the ankles, feet, or hands, sudden weight gain, unusual weakness or fatigue Heart rhythm changes--fast or irregular heartbeat, dizziness, feeling faint or lightheaded, chest pain, trouble breathing High thyroid levels (hyperthyroidism)--fast or irregular heartbeat, weight loss, excessive sweating or sensitivity to heat, tremors or shaking, anxiety, nervousness, irregular menstrual cycle or spotting Liver injury--right upper belly pain, loss of appetite, nausea, light-colored stool, dark yellow or brown urine, yellowing skin or eyes, unusual weakness or fatigue Low thyroid levels (hypothyroidism)--unusual weakness or fatigue, sensitivity to cold, constipation, hair loss, dry skin, weight gain, feelings of  depression Lung injury--shortness of breath or trouble breathing, cough, spitting up blood, chest pain, fever Pain, tingling, or  numbness in the hands or feet, muscle weakness, trouble walking, loss of balance or coordination Side effects that usually do not require medical attention (report to your care team if they continue or are bothersome): Nausea Vomiting This list may not describe all possible side effects. Call your doctor for medical advice about side effects. You may report side effects to FDA at 1-800-FDA-1088. Where should I keep my medication? Keep out of the reach of children and pets. Store at room temperature between 20 and 25 degrees C (68 and 77 degrees F). Protect from light. Keep container tightly closed. Throw away any unused medication after the expiration date. NOTE: This sheet is a summary. It may not cover all possible information. If you have questions about this medicine, talk to your doctor, pharmacist, or health care provider.  2023 Elsevier/Gold Standard (2020-08-26 00:00:00)

## 2022-08-06 NOTE — Progress Notes (Signed)
Electrophysiology Office Note   Date:  08/06/2022   ID:  Averill, Winters 06-28-1941, MRN [id number]  PCP:  Street, [name], MD  Cardiologist:  Bing Matter Primary Electrophysiologist:  Shealee Yordy Jorja Loa, MD    Chief Complaint: SVT/AF   History of Present Illness: Debra Harrison is a 82 y.o. female who is being seen today for the evaluation of palpitations at the request of Georgeanna Lea, MD. Presenting today for electrophysiology evaluation.  She has a history significant for SVT, CKD, hypertension.  She was referred to general cardiology with dizziness.  She had 1 episode of syncope.  She wore a cardiac monitor that showed runs of SVT and a short run of ventricular tachycardia.  She was asymptomatic.  She had a normal echo.  Due to the ventricular tachycardia, her beta-blocker was increased.  She wore a second monitor that showed atrial fibrillation, longest episode 3 hours 27 minutes.  She presented to [facility] 07/22/2022 with near syncope.  EKG at the time showed episode of atrial flutter.  She was started on diltiazem and converted to normal rhythm.  She continued to have episodes of tachycardia since then.  She is on metoprolol, but this is only moderately helping.  She would prefer a rhythm control strategy.  She has been diagnosed with obstructive sleep apnea and has been wearing her CPAP.  She feels less fatigued since starting on her CPAP.  Possible she has plans on seeing gastroenterology about her bloody stools prior to starting anticoagulation.  This is  Today, she denies symptoms of palpitations, chest pain, shortness of breath, orthopnea, PND, lower extremity edema, claudication, dizziness, presyncope, syncope, bleeding, or neurologic sequela. The patient is tolerating medications without difficulties.    Past Medical History:  Diagnosis Date   Abnormal electrocardiogram (ECG) (EKG) 09/18/2017   Acid reflux    Adult-onset obesity 09/09/2017   Ankle  edema 09/09/2017   APC (atrial premature contractions) 09/18/2017   Benign essential hypertension 09/09/2017   Benign paroxysmal vertigo 09/09/2017   Bilateral carotid bruits 08/28/2016   Borderline hyperglycemia 09/09/2017   Cervical radiculopathy 08/28/2016   Chest pain 09/09/2017   Chronic renal disease, stage III (HCC) 09/09/2017   Degenerative arthritis of hip    Dyslipidemia 09/09/2017   Dysphagia 09/09/2017   GERD (gastroesophageal reflux disease) 09/09/2017   Hypercholesterolemia    Hypertension    Idiopathic peripheral neuropathy 08/28/2016   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 Mardene Speak   TOTAL HIP ARTHROPLASTY Right 02/01/2008     Current Outpatient Medications  Medication Sig Dispense Refill   amiodarone (PACERONE) 200 MG tablet Take 1 tablet (200 mg total) TWICE daily for one month, then take 1 tablet ONCE daily thereafter 60 tablet 5   furosemide (LASIX) 40 MG tablet Take 40 mg by mouth daily. Cana take 80 mg daily if needed for  fluid     losartan (COZAAR) 100 MG tablet Take 100 mg by mouth daily.     metoprolol succinate (TOPROL-XL) 50 MG 24 hr tablet Take 1.5 tablets (75 mg total) by mouth daily. Take with or immediately following a meal. 135 tablet 2   omeprazole (PRILOSEC) 40 MG capsule Take 40 mg by mouth daily as needed (heartburmn/acid reflux).     No current facility-administered  medications for this visit.    Allergies:   Methocarbamol, Tetracyclines & related, Statins, Tetracycline, Zetia [ezetimibe], and Ace inhibitors   Social History:  The patient  reports that she quit smoking about 37 years ago. Her smoking use included cigarettes. She has never used smokeless tobacco. She reports current alcohol use. She reports that she does not use drugs.   Family History:  The patient's family history includes AAA (abdominal aortic aneurysm) in her brother; Colon cancer in her mother; Congestive Heart Failure in her mother; Diabetes in her father and mother; Heart disease in her brother and father; Heart failure in her father; Hypertension in her father and mother; Irregular heart beat in her brother; Leukemia in her brother; Thyroid disease in an other family member.    ROS:  Please see the history of present illness.   Otherwise, review of systems is positive for none.   All other systems are reviewed and negative.    PHYSICAL EXAM: VS:  BP (!) 140/72   Pulse (!) 53   Ht 5\' 8"  (1.727 m)   Wt 203 lb 9.6 oz (92.4 kg)   SpO2 96%   BMI 30.96 kg/m  , BMI Body mass index is 30.96 kg/m. GEN: Well nourished, well developed, in no acute distress  HEENT: normal  Neck: no JVD, carotid bruits, or masses Cardiac: RRR; no murmurs, rubs, or gallops,no edema  Respiratory:  clear to auscultation bilaterally, normal work of breathing GI: soft, nontender, nondistended, + BS MS: no deformity or atrophy  Skin: warm and dry Neuro:  Strength and sensation are intact Psych: euthymic mood, full affect  EKG:  EKG is ordered today. Personal review of the ekg ordered shows sinus rhythm, PACs, rate 53  Recent Labs: 06/29/2022: BUN 16; Creatinine, Ser 0.84; Hemoglobin 13.5; Platelets 280; Potassium 4.4; Sodium 140    Lipid Panel     Component Value Date/Time   CHOL 219 (H) 05/11/2022 0853   TRIG 110 05/11/2022 0853   HDL 53 05/11/2022 0853   CHOLHDL 4.1 05/11/2022 0853   LDLCALC  146 (H) 05/11/2022 0853     Wt Readings from Last 3 Encounters:  08/06/22 203 lb 9.6 oz (92.4 kg)  05/11/22 204 lb 9.6 oz (92.8 kg)  02/15/22 203 lb 12.8 oz (92.4 kg)      Other studies Reviewed: Additional studies/ records that were reviewed today include: TTE 04/03/22  Review of the above records today demonstrates:   1. Sigmoid septum. Left ventricular ejection fraction, by estimation, is  60 to 65%. The left ventricle has normal function. The left ventricle has  no regional wall motion abnormalities. There is mild left ventricular  hypertrophy. Left ventricular  diastolic parameters are consistent with Grade II diastolic dysfunction  (pseudonormalization).   2. Right ventricular systolic function is normal. The right ventricular  size is normal. There is mildly elevated pulmonary artery systolic  pressure.   3. Left atrial size was mildly dilated.   4. The mitral valve is normal in structure. Mild mitral valve  regurgitation. No evidence of mitral stenosis.  5. The aortic valve is calcified. There is moderate calcification of the  aortic valve. There is moderate thickening of the aortic valve. Aortic  valve regurgitation is mild. Aortic valve sclerosis/calcification is  present, without any evidence of aortic  stenosis.   6. Aneurysm of the ascending aorta, measuring 44 mm.   7. The inferior vena cava is normal in size with greater than 50%  respiratory variability, suggesting right atrial pressure of 3 mmHg.   Cardiac monitor 06/26/2022 personally reviewed Episode of atrial fibrillation noted, total burden 1%, heart rate between 76 and 164. 1 episode of ventricle tachycardia 6 beats at rate 113. 19 episodes of supraventricular tachycardias.    ASSESSMENT AND PLAN:  1.  SVT: Noted on cardiac monitor.  Beta-blocker was increased.  No acute complaints.  2.  Ventricular tachycardia: Short episodes on cardiac monitoring.  Patient is asymptomatic.  Echo normal.   Beta-blocker has been increased.  3.  Paroxysmal atrial fibrillation/atrial flutter: Episode of 3 hours 27 minutes on cardiac monitor.  CHA2DS2-VASc of at least 4.  Did had an emergency room visit for atrial flutter with near syncope.  She would prefer a rhythm control strategy and would like to avoid long-term medications.  In the short-term we Payeton Germani start her on amiodarone.  When she sees her gastroenterologist and can be a long-term anticoagulation, Jailine Lieder plan for ablation. Plan discussed with primary cardiology.  Risk, benefits, and alternatives to EP study and radiofrequency ablation for afib were also discussed in detail today. These risks include but are not limited to stroke, bleeding, vascular damage, tamponade, perforation, damage to the esophagus, lungs, and other structures, pulmonary vein stenosis, worsening renal function, and death. The patient understands these risk and wishes to proceed.  We Vernisha Bacote therefore proceed with catheter ablation at the next available time.  Carto, ICE, anesthesia are requested for the procedure.  Leda Bellefeuille also obtain CT PV protocol prior to the procedure to exclude LAA thrombus and further evaluate atrial anatomy.   4.  Obstructive sleep apnea: CPAP compliance encouraged  5.  Secondary hypercoagulable state: Currently on no anticoagulation as she had heme positive stools.  Planning to see gastroenterology prior to starting anticoagulation for atrial fibrillation/flutter.  Current medicines are reviewed at length with the patient today.   The patient does not have concerns regarding her medicines.  The following changes were made today:  start amiodarone  Labs/ tests ordered today include:  Orders Placed This Encounter  Procedures   EKG 12-Lead     Disposition:   FU with Lometa Riggin 6 months  Signed, Ruble Pumphrey Meredith Leeds, MD  08/06/2022 9:50 AM     [place] [address] [place] [address] [phone]  (office) [phone] (fax)

## 2022-08-13 ENCOUNTER — Encounter: Payer: Self-pay | Admitting: Cardiology

## 2022-08-13 ENCOUNTER — Ambulatory Visit: Payer: Medicare Other | Attending: Cardiology | Admitting: Cardiology

## 2022-08-13 VITALS — BP 137/60 | HR 56 | Wt 201.0 lb

## 2022-08-13 DIAGNOSIS — G4733 Obstructive sleep apnea (adult) (pediatric): Secondary | ICD-10-CM

## 2022-08-13 DIAGNOSIS — I10 Essential (primary) hypertension: Secondary | ICD-10-CM

## 2022-08-13 DIAGNOSIS — D510 Vitamin B12 deficiency anemia due to intrinsic factor deficiency: Secondary | ICD-10-CM | POA: Diagnosis not present

## 2022-08-13 NOTE — Progress Notes (Signed)
SLEEP MEDICINE VIRTUAL CONSULT NOTE via Video Note   Because of Debra Harrison's co-morbid illnesses, she is at least at moderate risk for complications without adequate follow up.  This format is felt to be most appropriate for this patient at this time.  All issues noted in this document were discussed and addressed.  A limited physical exam was performed with this format.  Please refer to the patient's chart for her consent to telehealth for [facility].      Date:  08/13/2022   ID:  Debra Harrison, DOB Oct 14, 1940, MRN [id number] The patient was identified using 2 identifiers.  Patient Location: Home Provider Location: Office/Clinic   PCP:  Street, [place], [place] Providers Cardiologist:  Jenne Campus, MD  Electrophysiologist:  Will Meredith Leeds, MD     Evaluation Performed:  New Patient Evaluation  Chief Complaint:  OSA  History of Present Illness:    Debra Harrison is a 82 y.o. female who is being seen today for the evaluation of OSA at the request of Tristan Schroeder, MD.  Debra Harrison is a 82 y.o. female with a history of hypertension, CKD stage III, hyperlipidemia, second-degree heart block and ventricular tachycardia/SVT who was seen by Dr. Agustin Cree in October 2023 and at that time she complained of feeling sleepy during the day along with significant snoring. She would fall asleep quickly if she watches TV.    A home sleep study was done which demonstrated moderate obstructive sleep apnea with an AHI of 24.2/h and nocturnal hypoxemia.  O2 saturations were less than 88% for 15.3 minutes.  She was started on auto CPAP from 4 to 15 cm H2O.  She is now referred to sleep medicine for establishment of care for obstructive sleep apnea and further treatment.  She is doing well with her PAP device and thinks that she has gotten used to it.  She tolerates the full face mask and feels the pressure is adequate.  She has been sleeping  much better and sleeps throughout the night without waking.  Since going on PAP she feels rested in the am and has no significant daytime sleepiness.  She denies any significant mouth or nasal dryness or nasal congestion.  She does not know if she still snores.    Past Medical History:  Diagnosis Date   Abnormal electrocardiogram (ECG) (EKG) 09/18/2017   Acid reflux    Adult-onset obesity 09/09/2017   Ankle edema 09/09/2017   APC (atrial premature contractions) 09/18/2017   Benign essential hypertension 09/09/2017   Benign paroxysmal vertigo 09/09/2017   Bilateral carotid bruits 08/28/2016   Borderline hyperglycemia 09/09/2017   Cervical radiculopathy 08/28/2016   Chest pain 09/09/2017   Chronic renal disease, stage III ([place]) 09/09/2017   Degenerative arthritis of hip    Dyslipidemia 09/09/2017   Dysphagia 09/09/2017   GERD (gastroesophageal reflux disease) 09/09/2017   Hypercholesterolemia    Hypertension    Idiopathic peripheral neuropathy 08/28/2016   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   Irregular heart rate 09/09/2017   Localized osteoarthrosis, lower leg 09/09/2017   Lower limb length difference 03/12/2017   Osteoarthritis    Pernicious anemia 09/09/2017   Second degree heart block 09/09/2017   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 Mardene Speak   TOTAL HIP ARTHROPLASTY Right 02/01/2008     Current Meds  Medication Sig   amiodarone (PACERONE) 200 MG tablet Take 1 tablet (200 mg total) TWICE daily for one month, then take 1 tablet ONCE daily thereafter   furosemide (LASIX) 40 MG tablet Take 40 mg by mouth daily. Cana take 80 mg daily if needed for  fluid   losartan (COZAAR) 100 MG tablet Take 100 mg by mouth daily.   metoprolol succinate (TOPROL-XL) 50 MG 24 hr tablet Take 1.5 tablets (75 mg total) by mouth daily. Take with or immediately following a meal.   omeprazole (PRILOSEC) 40 MG capsule Take 40 mg by mouth daily as needed (heartburmn/acid reflux).     Allergies:   Methocarbamol, Tetracyclines & related, Statins, Tetracycline, Zetia [ezetimibe], and Ace inhibitors   Social History   Tobacco Use   Smoking status: Former    Years: 15.00    Types: Cigarettes    Quit date: 1987    Years since quitting: 37.1   Smokeless tobacco: Never  Vaping Use   Vaping Use: Never used  Substance Use Topics   Alcohol use: Yes    Comment: occasionally, on a less than daily basis   Drug use: No     Family Hx: The patient's family history includes AAA (abdominal aortic aneurysm) in her brother; Colon cancer in her mother; Congestive Heart Failure in her mother; Diabetes in her father and mother; Heart disease in her brother and father; Heart failure in her father; Hypertension in her father and mother; Irregular heart beat in her brother; Leukemia in her brother; Thyroid disease in an other family member.  ROS:   Please see the history of present illness.     All other systems reviewed and are negative.   Prior Sleep studies:   The following studies were reviewed today:  Home sleep study and PAP compliance download  Labs/Other Tests and Data Reviewed:     Recent Labs: 06/29/2022: BUN 16; Creatinine, Ser 0.84; Hemoglobin 13.5; Platelets 280; Potassium 4.4; Sodium 140    Wt Readings from Last 3 Encounters:  08/13/22 201 lb (91.2 kg)  08/06/22 203 lb 9.6 oz (92.4 kg)  05/11/22 204 lb 9.6 oz (92.8 kg)     Risk Assessment/Calculations:      STOP-Bang Score:  4      Objective:    Vital Signs:  BP 137/60   Pulse (!) 56   Wt 201 lb (91.2 kg)   BMI 30.56 kg/m    VITAL SIGNS:  reviewed GEN:  no acute distress EYES:   sclerae anicteric, EOMI - Extraocular Movements Intact RESPIRATORY:  normal respiratory effort, symmetric expansion CARDIOVASCULAR:  no peripheral edema SKIN:  no rash, lesions or ulcers. MUSCULOSKELETAL:  no obvious deformities. NEURO:  alert and oriented x 3, no obvious focal deficit PSYCH:  normal affect  ASSESSMENT & PLAN:    OSA - The patient is tolerating PAP therapy well without any problems. The PAP download performed by his DME was personally reviewed and interpreted by me today and showed an AHI of 4.1 /hr on auto CPAP from 4-15 cm H2O with 80% compliance in using more than 4 hours nightly.  The patient has been using and benefiting from PAP use and will continue to benefit from therapy.  HTN -BP controlled at home -Continue prescription drug management with losartan 100 mg daily Toprol-XL 75 mg daily with as needed refills   Time:   Today, I have spent 15 minutes with the patient with telehealth technology discussing the above problems.     Medication Adjustments/Labs and Tests Ordered: Current medicines are reviewed at length with the patient today.  Concerns regarding medicines are outlined above.   Tests Ordered: No orders of the defined types were placed in this encounter.   Medication Changes: No orders of the defined types were placed in this encounter.   Follow Up:  In Person in 1 year(s)  Signed, Fransico Him, MD  08/13/2022 10:25 AM    [place]

## 2022-08-13 NOTE — Patient Instructions (Signed)
Medication Instructions:  Your physician recommends that you continue on your current medications as directed. Please refer to the Current Medication list given to you today.  *If you need a refill on your cardiac medications before your next appointment, please call your pharmacy*   Lab Work: None.  If you have labs (blood work) drawn today and your tests are completely normal, you will receive your results only by: MyChart Message (if you have MyChart) OR A paper copy in the mail If you have any lab test that is abnormal or we need to change your treatment, we will call you to review the results.   Testing/Procedures: None.   Follow-Up: At [facility], you and your health needs are our priority.  As part of our continuing mission to provide you with exceptional heart care, we have created designated Provider Care Teams.  These Care Teams include your primary Cardiologist (physician) and Advanced Practice Providers (APPs -  Physician Assistants and Nurse Practitioners) who all work together to provide you with the care you need, when you need it.  We recommend signing up for the patient portal called "MyChart".  Sign up information is provided on this After Visit Summary.  MyChart is used to connect with patients for Virtual Visits (Telemedicine).  Patients are able to view lab/test results, encounter notes, upcoming appointments, etc.  Non-urgent messages can be sent to your provider as well.   To learn more about what you can do with MyChart, go to [email or web address].    Your next appointment:   1 year(s)  Provider:   Dr. Traci Turner, MD   

## 2022-08-29 ENCOUNTER — Telehealth: Payer: Self-pay | Admitting: Cardiology

## 2022-08-29 ENCOUNTER — Encounter: Payer: Self-pay | Admitting: Cardiology

## 2022-08-29 MED ORDER — AMIODARONE HCL 200 MG PO TABS: 200.0000 mg | ORAL_TABLET | Freq: Every day | ORAL | 2 refills | Status: DC

## 2022-08-29 NOTE — Telephone Encounter (Signed)
Returned pt call. Pt had HA and feeling dizzy. BP 97/54, but it went back up Now HR 60s.  Pt advised to decrease Amiodarone to 1 tablet (200 mg total) once daily. Advised to let us know if no improvement. Patient verbalized understanding and agreeable to plan.

## 2022-08-29 NOTE — Telephone Encounter (Signed)
  Pt's pcp office called, Page said, pt is concern about the symptoms she sent through [facility] and asking if a nurse can call the pt back today

## 2022-08-31 DIAGNOSIS — R002 Palpitations: Secondary | ICD-10-CM | POA: Diagnosis not present

## 2022-08-31 DIAGNOSIS — R001 Bradycardia, unspecified: Secondary | ICD-10-CM | POA: Diagnosis not present

## 2022-08-31 DIAGNOSIS — R42 Dizziness and giddiness: Secondary | ICD-10-CM | POA: Diagnosis not present

## 2022-08-31 DIAGNOSIS — Z79899 Other long term (current) drug therapy: Secondary | ICD-10-CM | POA: Diagnosis not present

## 2022-08-31 DIAGNOSIS — I4891 Unspecified atrial fibrillation: Secondary | ICD-10-CM | POA: Diagnosis not present

## 2022-08-31 DIAGNOSIS — I700 Atherosclerosis of aorta: Secondary | ICD-10-CM | POA: Diagnosis not present

## 2022-08-31 DIAGNOSIS — I10 Essential (primary) hypertension: Secondary | ICD-10-CM | POA: Diagnosis not present

## 2022-08-31 DIAGNOSIS — E7800 Pure hypercholesterolemia, unspecified: Secondary | ICD-10-CM | POA: Diagnosis not present

## 2022-08-31 DIAGNOSIS — I517 Cardiomegaly: Secondary | ICD-10-CM | POA: Diagnosis not present

## 2022-08-31 DIAGNOSIS — Z87891 Personal history of nicotine dependence: Secondary | ICD-10-CM | POA: Diagnosis not present

## 2022-09-10 DIAGNOSIS — D519 Vitamin B12 deficiency anemia, unspecified: Secondary | ICD-10-CM | POA: Diagnosis not present

## 2022-09-12 DIAGNOSIS — K219 Gastro-esophageal reflux disease without esophagitis: Secondary | ICD-10-CM | POA: Diagnosis not present

## 2022-09-12 DIAGNOSIS — R195 Other fecal abnormalities: Secondary | ICD-10-CM | POA: Diagnosis not present

## 2022-09-12 DIAGNOSIS — R1310 Dysphagia, unspecified: Secondary | ICD-10-CM | POA: Diagnosis not present

## 2022-10-08 DIAGNOSIS — D519 Vitamin B12 deficiency anemia, unspecified: Secondary | ICD-10-CM | POA: Diagnosis not present

## 2022-10-25 ENCOUNTER — Ambulatory Visit: Payer: Medicare Other | Attending: Cardiology | Admitting: Cardiology

## 2022-10-25 ENCOUNTER — Encounter: Payer: Self-pay | Admitting: Cardiology

## 2022-10-25 VITALS — BP 160/78 | HR 51 | Ht 68.0 in | Wt 204.0 lb

## 2022-10-25 DIAGNOSIS — I4892 Unspecified atrial flutter: Secondary | ICD-10-CM | POA: Insufficient documentation

## 2022-10-25 DIAGNOSIS — I10 Essential (primary) hypertension: Secondary | ICD-10-CM | POA: Insufficient documentation

## 2022-10-25 DIAGNOSIS — I472 Ventricular tachycardia, unspecified: Secondary | ICD-10-CM | POA: Diagnosis not present

## 2022-10-25 DIAGNOSIS — E785 Hyperlipidemia, unspecified: Secondary | ICD-10-CM | POA: Diagnosis not present

## 2022-10-25 DIAGNOSIS — I471 Supraventricular tachycardia, unspecified: Secondary | ICD-10-CM | POA: Insufficient documentation

## 2022-10-25 DIAGNOSIS — I7121 Aneurysm of the ascending aorta, without rupture: Secondary | ICD-10-CM | POA: Diagnosis not present

## 2022-10-25 MED ORDER — METOPROLOL SUCCINATE ER 25 MG PO TB24: 25.0000 mg | ORAL_TABLET | Freq: Every day | ORAL | 3 refills | Status: AC

## 2022-10-25 NOTE — Addendum Note (Signed)
Addended by: Baldo Ash D on: 10/25/2022 12:10 PM   Modules accepted: Orders

## 2022-10-25 NOTE — Patient Instructions (Signed)
Medication Instructions:   DECREASE: Metoprolol to 25mg  daily- you may 1/2 your current dose and your next refill will reflect your new dose.   Lab Work: None Ordered If you have labs (blood work) drawn today and your tests are completely normal, you will receive your results only by: MyChart Message (if you have MyChart) OR A paper copy in the mail If you have any lab test that is abnormal or we need to change your treatment, we will call you to review the results.   Testing/Procedures: None Ordered   Follow-Up: At [facility], you and your health needs are our priority.  As part of our continuing mission to provide you with exceptional heart care, we have created designated Provider Care Teams.  These Care Teams include your primary Cardiologist (physician) and Advanced Practice Providers (APPs -  Physician Assistants and Nurse Practitioners) who all work together to provide you with the care you need, when you need it.  We recommend signing up for the patient portal called "MyChart".  Sign up information is provided on this After Visit Summary.  MyChart is used to connect with patients for Virtual Visits (Telemedicine).  Patients are able to view lab/test results, encounter notes, upcoming appointments, etc.  Non-urgent messages can be sent to your provider as well.   To learn more about what you can do with MyChart, go to [email or web address].    Your next appointment:   1 month(s)  The format for your next appointment:   In Person  Provider:   Gypsy Balsam, MD    Other Instructions NA

## 2022-10-25 NOTE — Progress Notes (Signed)
Cardiology Office Note:    Date:  10/25/2022   ID:  BREHAN MCGIVNEY, DOB 03/24/1941, MRN [id number]  PCP:  Street, [name], MD  Cardiologist:  Gypsy Balsam, MD    Referring MD: Street, [name], *   Chief Complaint  Patient presents with   Follow-up  Doing fine  History of Present Illness:    Debra Harrison is a 82 y.o. female past medical history significant for supraventricular tachycardia, atrial flutter, atrial fibrillation, ventricular tachycardia, chronic kidney failure, essential hypertension, obstructive sleep apnea.  Since have seen her last time which was in October last year she ended being in the hospital because of episode of near syncope, she was also noted to have positive stool for guaiac.  She was seen by our EP colleague in January with consideration of management of ventricular tachycardia and SVT as well as atrial fibrillation/flutter.  She was put on amiodarone.  However end up going to the hospital with bradycardia dose of amiodarone has been reduced to 200 mg daily.  She comes today she said palpitations are much better.  There was an issue of GI bleeding she does have stool guaiac positive history of GI bleed previously.  She is scheduled to have endoscopy and colonoscopy Tuesday next week.  Overall she is feeling fine.  There is no chest pain tightness squeezing pressure burning chest.  She is able to take activity of daily living.  She also mentions that since the reduction dose of amiodarone she is feeling better.  Past Medical History:  Diagnosis Date   Abnormal electrocardiogram (ECG) (EKG) 09/18/2017   Acid reflux    Adult-onset obesity 09/09/2017   Ankle edema 09/09/2017   APC (atrial premature contractions) 09/18/2017   Benign essential hypertension 09/09/2017   Benign paroxysmal vertigo 09/09/2017   Bilateral carotid bruits 08/28/2016   Borderline hyperglycemia 09/09/2017   Cervical radiculopathy 08/28/2016   Chest pain 09/09/2017   Chronic renal  disease, stage III 09/09/2017   Degenerative arthritis of hip    Dyslipidemia 09/09/2017   Dysphagia 09/09/2017   GERD (gastroesophageal reflux disease) 09/09/2017   Hypercholesterolemia    Hypertension    Idiopathic peripheral neuropathy 08/28/2016   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 Mardene Speak   TOTAL HIP ARTHROPLASTY Right 02/01/2008    Current Medications: Current Meds  Medication Sig   amiodarone (PACERONE) 200 MG tablet Take 1 tablet (200 mg total) by mouth daily.   furosemide (LASIX) 40 MG tablet Take 40 mg by mouth daily. Cana take 80 mg daily if needed for fluid   losartan (COZAAR) 100 MG tablet Take 100 mg by mouth daily.   metoprolol succinate (TOPROL-XL) 50 MG 24 hr tablet Take 1.5 tablets (75 mg total) by mouth daily. Take with or immediately following a meal.   omeprazole (PRILOSEC) 40 MG capsule Take 40 mg by mouth daily  as needed (heartburmn/acid reflux).     Allergies:   Methocarbamol, Tetracyclines & related, Statins, Tetracycline, Zetia [ezetimibe], and Ace inhibitors   Social History   Socioeconomic History   Marital status: Widowed    Spouse name: Not on file   Number of children: Not on file   Years of education: Not on file   Highest education level: Not on file  Occupational History   Not on file  Tobacco Use   Smoking status: Former    Years: 15    Types:  Cigarettes    Quit date: 19    Years since quitting: 37.3   Smokeless tobacco: Never  Vaping Use   Vaping Use: Never used  Substance and Sexual Activity   Alcohol use: Yes    Comment: occasionally, on a less than daily basis   Drug use: No   Sexual activity: Not on file  Other Topics Concern   Not on file  Social History Narrative   Not on file   Social Determinants of Health   Financial Resource Strain: Not on file  Food Insecurity: Not on file  Transportation Needs: Not on file  Physical Activity: Not on file  Stress: Not on file  Social Connections: Not on file     Family History: The patient's family history includes AAA (abdominal aortic aneurysm) in her brother; Colon cancer in her mother; Congestive Heart Failure in her mother; Diabetes in her father and mother; Heart disease in her brother and father; Heart failure in her father; Hypertension in her father and mother; Irregular heart beat in her brother; Leukemia in her brother; Thyroid disease in an other family member. ROS:   Please see the history of present illness.    All 14 point review of systems negative except as described per history of present illness  EKGs/Labs/Other Studies Reviewed:      Recent Labs: 06/29/2022: BUN 16; Creatinine, Ser 0.84; Hemoglobin 13.5; Platelets 280; Potassium 4.4; Sodium 140  Recent Lipid Panel    Component Value Date/Time   CHOL 219 (H) 05/11/2022 0853   TRIG 110 05/11/2022 0853   HDL 53 05/11/2022 0853   CHOLHDL 4.1 05/11/2022 0853   LDLCALC 146 (H) 05/11/2022 0853    Physical Exam:    VS:  BP (!) 160/78 (BP Location: Right Arm, Patient Position: Sitting, Cuff Size: Large)   Pulse (!) 51   Ht 5\' 8"  (1.727 m)   Wt 204 lb (92.5 kg)   SpO2 94%   BMI 31.02 kg/m     Wt Readings from Last 3 Encounters:  10/25/22 204 lb (92.5 kg)  08/13/22 201 lb (91.2 kg)  08/06/22 203 lb 9.6 oz (92.4 kg)     GEN:  Well nourished, well developed in no acute distress HEENT:  Normal NECK: No JVD; No carotid bruits LYMPHATICS: No lymphadenopathy CARDIAC: RRR, no murmurs, no rubs, no gallops RESPIRATORY:  Clear to auscultation without rales, wheezing or rhonchi  ABDOMEN: Soft, non-tender, non-distended MUSCULOSKELETAL:  No edema; No deformity  SKIN: Warm and dry LOWER EXTREMITIES: no swelling NEUROLOGIC:  Alert and oriented x 3 PSYCHIATRIC:  Normal affect   ASSESSMENT:    1. Supraventricular tachycardia   2. Ventricular tachycardia   3. Paroxysmal atrial flutter   4. Benign essential hypertension   5. Aneurysm of ascending aorta without rupture   6. Dyslipidemia    PLAN:    In order of problems listed above:  Paroxysmal atrial fibrillation/atrial flutter.  Not anticoagulated because of GI bleed  history.  Colonoscopy on Tuesday and endoscopy on Tuesday, will wait for results of this with potentially reinitiation of anticoagulation.  If will not be able to do it then we will consider Watchman device.  In the meantime continue with amiodarone 200 g daily.  Will make sure she does not follow-up with our EP team.  I see her back in about a month in the office. History of ventricular tachycardia stress test echocardiogram showing a low risk arrhythmia. Aneurysm of the ascending aorta.  Will continue monitoring. Essential hypertension blood pressure elevated today but I did review her blood test that she has done at home and it is always good. Bradycardia.  I will cut down her metoprolol to only 25 mg daily   Medication Adjustments/Labs and Tests Ordered: Current medicines are reviewed at length with the patient today.  Concerns regarding medicines are outlined above.  No orders of the defined types were placed in this encounter.  Medication changes: No orders of the defined types were placed in this encounter.   Signed, Georgeanna Leaobert J. Florena Kozma, MD, [facility] 10/25/2022 11:59 AM    [facility]

## 2022-10-30 DIAGNOSIS — K635 Polyp of colon: Secondary | ICD-10-CM | POA: Diagnosis not present

## 2022-10-30 DIAGNOSIS — K2101 Gastro-esophageal reflux disease with esophagitis, with bleeding: Secondary | ICD-10-CM | POA: Diagnosis not present

## 2022-10-30 DIAGNOSIS — Z86010 Personal history of colonic polyps: Secondary | ICD-10-CM | POA: Diagnosis not present

## 2022-10-30 DIAGNOSIS — D126 Benign neoplasm of colon, unspecified: Secondary | ICD-10-CM | POA: Diagnosis not present

## 2022-10-30 DIAGNOSIS — R1310 Dysphagia, unspecified: Secondary | ICD-10-CM | POA: Diagnosis not present

## 2022-10-30 DIAGNOSIS — D131 Benign neoplasm of stomach: Secondary | ICD-10-CM | POA: Diagnosis not present

## 2022-10-30 DIAGNOSIS — I10 Essential (primary) hypertension: Secondary | ICD-10-CM | POA: Diagnosis not present

## 2022-10-30 DIAGNOSIS — K5520 Angiodysplasia of colon without hemorrhage: Secondary | ICD-10-CM | POA: Diagnosis not present

## 2022-10-30 DIAGNOSIS — D122 Benign neoplasm of ascending colon: Secondary | ICD-10-CM | POA: Diagnosis not present

## 2022-10-30 DIAGNOSIS — K648 Other hemorrhoids: Secondary | ICD-10-CM | POA: Diagnosis not present

## 2022-10-30 DIAGNOSIS — R195 Other fecal abnormalities: Secondary | ICD-10-CM | POA: Diagnosis not present

## 2022-10-30 DIAGNOSIS — K644 Residual hemorrhoidal skin tags: Secondary | ICD-10-CM | POA: Diagnosis not present

## 2022-10-30 DIAGNOSIS — K5730 Diverticulosis of large intestine without perforation or abscess without bleeding: Secondary | ICD-10-CM | POA: Diagnosis not present

## 2022-10-30 DIAGNOSIS — K449 Diaphragmatic hernia without obstruction or gangrene: Secondary | ICD-10-CM | POA: Diagnosis not present

## 2022-10-30 DIAGNOSIS — D124 Benign neoplasm of descending colon: Secondary | ICD-10-CM | POA: Diagnosis not present

## 2022-11-12 DIAGNOSIS — D519 Vitamin B12 deficiency anemia, unspecified: Secondary | ICD-10-CM | POA: Diagnosis not present

## 2022-11-15 ENCOUNTER — Encounter: Payer: Self-pay | Admitting: Cardiology

## 2022-11-28 ENCOUNTER — Encounter: Payer: Self-pay | Admitting: Cardiology

## 2022-11-28 ENCOUNTER — Ambulatory Visit: Payer: Medicare Other | Attending: Cardiology | Admitting: Cardiology

## 2022-11-28 VITALS — BP 150/90 | HR 55 | Ht 68.0 in | Wt 200.4 lb

## 2022-11-28 DIAGNOSIS — I10 Essential (primary) hypertension: Secondary | ICD-10-CM

## 2022-11-28 DIAGNOSIS — I7121 Aneurysm of the ascending aorta, without rupture: Secondary | ICD-10-CM

## 2022-11-28 DIAGNOSIS — E7800 Pure hypercholesterolemia, unspecified: Secondary | ICD-10-CM

## 2022-11-28 DIAGNOSIS — I4892 Unspecified atrial flutter: Secondary | ICD-10-CM | POA: Diagnosis not present

## 2022-11-28 NOTE — Patient Instructions (Addendum)
Medication Instructions:  Your physician recommends that you continue on your current medications as directed. Please refer to the Current Medication list given to you today.  *If you need a refill on your cardiac medications before your next appointment, please call your pharmacy*   Lab Work: Your physician recommends that you return for lab work in:   Labs today: CBC, BMP  If you have labs (blood work) drawn today and your tests are completely normal, you will receive your results only by: MyChart Message (if you have MyChart) OR A paper copy in the mail If you have any lab test that is abnormal or we need to change your treatment, we will call you to review the results.   Testing/Procedures: None   Follow-Up: At [facility], you and your health needs are our priority.  As part of our continuing mission to provide you with exceptional heart care, we have created designated Provider Care Teams.  These Care Teams include your primary Cardiologist (physician) and Advanced Practice Providers (APPs -  Physician Assistants and Nurse Practitioners) who all work together to provide you with the care you need, when you need it.  We recommend signing up for the patient portal called "MyChart".  Sign up information is provided on this After Visit Summary.  MyChart is used to connect with patients for Virtual Visits (Telemedicine).  Patients are able to view lab/test results, encounter notes, upcoming appointments, etc.  Non-urgent messages can be sent to your provider as well.   To learn more about what you can do with MyChart, go to [email or web address].    Your next appointment:   3 month(s)  Provider:   Gypsy Balsam, MD    Other Instructions None

## 2022-11-28 NOTE — Progress Notes (Signed)
Cardiology Office Note:    Date:  11/28/2022   ID:  Debra Harrison, DOB November 13, 1940, MRN [id number]  PCP:  Street, [name], MD  Cardiologist:  Gypsy Balsam, MD    Referring MD: Street, [name], *   Chief Complaint  Patient presents with   Follow-up    History of Present Illness:    Debra Harrison is a 82 y.o. female with past medical history significant for supraventricular tachycardia, atrial flutter, paroxysmal atrial fibrillation, ventricular tachycardia, chronic kidney disease, essential hypertension, obstructive sleep apnea.  I did see her last time in April after hospitalization.  She apparently had episode of syncope she was found to have stool guaiac positive, she also was seen by our EP colleague with consideration of management of ventricular arrhythmia supraventricular tachycardia as well as atrial fibrillation flutter, she was put on amiodarone however she had difficulty tolerating this medication she ended up discontinuing it and feeling much better.  The difficulty she had was lower extremities neuropathy.  She was not anticoagulated in spite of the fact that her CHADS2 Vascor is high enough to justify anticoagulation, she did have recent GI workup done and she tells me that her GI workup came fine and she is cleared to start taking anticoagulation.  Will get confirmation of this from Dr. Jennye Boroughs who is her gastroenterologist.  She is crying today in the office tragedy strikes and apparently one of her daughter heart fall and broke her pelvis and leg on top of that one of her brothers died.  She is obviously very shaken by that.  Past Medical History:  Diagnosis Date   Abnormal electrocardiogram (ECG) (EKG) 09/18/2017   Acid reflux    Adult-onset obesity 09/09/2017   Ankle edema 09/09/2017   APC (atrial premature contractions) 09/18/2017   Benign essential hypertension 09/09/2017   Benign paroxysmal vertigo 09/09/2017   Bilateral carotid bruits 08/28/2016    Borderline hyperglycemia 09/09/2017   Cervical radiculopathy 08/28/2016   Chest pain 09/09/2017   Chronic renal disease, stage III (HCC) 09/09/2017   Degenerative arthritis of hip    Dyslipidemia 09/09/2017   Dysphagia 09/09/2017   GERD (gastroesophageal reflux disease) 09/09/2017   Hypercholesterolemia    Hypertension    Idiopathic peripheral neuropathy 08/28/2016   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 Mardene Speak   TOTAL HIP ARTHROPLASTY Right 02/01/2008    Current Medications: Current Meds  Medication Sig   furosemide (LASIX) 40 MG tablet Take 40 mg by mouth daily. Cana take 80 mg daily if needed for fluid   losartan (COZAAR) 100 MG tablet Take 100 mg by mouth daily.   metoprolol succinate (TOPROL XL) 25 MG 24 hr tablet Take 1 tablet (25 mg total) by mouth daily.   omeprazole (PRILOSEC) 40 MG capsule Take 40 mg by mouth daily as needed (heartburmn/acid reflux).     Allergies:  Methocarbamol, Tetracyclines & related, Statins, Tetracycline, Zetia [ezetimibe], and Ace inhibitors   Social History   Socioeconomic History   Marital status: Widowed    Spouse name: Not on file   Number of children: Not on file   Years of education: Not on file   Highest education level: Not on file  Occupational History   Not on file  Tobacco Use   Smoking status: Former    Years: 15    Types: Cigarettes     Quit date: 63    Years since quitting: 37.3   Smokeless tobacco: Never  Vaping Use   Vaping Use: Never used  Substance and Sexual Activity   Alcohol use: Yes    Comment: occasionally, on a less than daily basis   Drug use: No   Sexual activity: Not on file  Other Topics Concern   Not on file  Social History Narrative   Not on file   Social Determinants of Health   Financial Resource Strain: Not on file  Food Insecurity: Not on file  Transportation Needs: Not on file  Physical Activity: Not on file  Stress: Not on file  Social Connections: Not on file     Family History: The patient's family history includes AAA (abdominal aortic aneurysm) in her brother; Colon cancer in her mother; Congestive Heart Failure in her mother; Diabetes in her father and mother; Heart disease in her brother and father; Heart failure in her father; Hypertension in her father and mother; Irregular heart beat in her brother; Leukemia in her brother; Thyroid disease in an other family member. ROS:   Please see the history of present illness.    All 14 point review of systems negative except as described per history of present illness  EKGs/Labs/Other Studies Reviewed:      Recent Labs: 06/29/2022: BUN 16; Creatinine, Ser 0.84; Hemoglobin 13.5; Platelets 280; Potassium 4.4; Sodium 140  Recent Lipid Panel    Component Value Date/Time   CHOL 219 (H) 05/11/2022 0853   TRIG 110 05/11/2022 0853   HDL 53 05/11/2022 0853   CHOLHDL 4.1 05/11/2022 0853   LDLCALC 146 (H) 05/11/2022 0853    Physical Exam:    VS:  BP (!) 150/90 (BP Location: Left Arm, Patient Position: Sitting)   Pulse (!) 55   Ht 5\' 8"  (1.727 m)   Wt 200 lb 6.4 oz (90.9 kg)   SpO2 97%   BMI 30.47 kg/m     Wt Readings from Last 3 Encounters:  11/28/22 200 lb 6.4 oz (90.9 kg)  10/25/22 204 lb (92.5 kg)  08/13/22 201 lb (91.2 kg)     GEN:  Well nourished, well developed in no acute distress HEENT: Normal NECK: No JVD; No  carotid bruits LYMPHATICS: No lymphadenopathy CARDIAC: RRR, no murmurs, no rubs, no gallops RESPIRATORY:  Clear to auscultation without rales, wheezing or rhonchi  ABDOMEN: Soft, non-tender, non-distended MUSCULOSKELETAL:  No edema; No deformity  SKIN: Warm and dry LOWER EXTREMITIES: no swelling NEUROLOGIC:  Alert and oriented x 3 PSYCHIATRIC:  Normal affect   ASSESSMENT:    1. Paroxysmal atrial flutter (HCC)   2. Benign essential hypertension   3. Aneurysm of ascending aorta without rupture (HCC)   4. Hypercholesterolemia    PLAN:    In order of problems listed above:  Paroxysmal atrial flutter/fibrillation and maintaining sinus rhythm.  Will check her Chem-7 today to see what will be appropriate dose of Eliquis.  If creatinine was 1 then 1.5 then only  2.5 twice daily will be the dose.  Will check a CBC today as well as a baseline.  Obviously will get note from her gastroenterologist. Benign essential hypertension seems to be uncontrolled today but she is crying in the office today shaken by recent family tragedies.  I was trying to put her on amlodipine send Medication to reduce blood pressure, but she said she does not want to do it because of the fact that she thinks her blood pressure is elevated because of emotional stress and she may be right about this. Aneurysm of the ascending aorta stable. Dyslipidemia will make arrangements for her cholesterol to be checked   Medication Adjustments/Labs and Tests Ordered: Current medicines are reviewed at length with the patient today.  Concerns regarding medicines are outlined above.  Orders Placed This Encounter  Procedures   CBC   EKG 12-Lead   Medication changes: No orders of the defined types were placed in this encounter.   Signed, Georgeanna Lea, MD, [facility] 11/28/2022 2:34 PM    [facility]

## 2022-11-29 LAB — CBC
Hematocrit: 41 % (ref 34.0–46.6)
Hemoglobin: 13.9 g/dL (ref 11.1–15.9)
MCH: 31.4 pg (ref 26.6–33.0)
MCHC: 33.9 g/dL (ref 31.5–35.7)
MCV: 93 fL (ref 79–97)
Platelets: 277 10*3/uL (ref 150–450)
RBC: 4.42 x10E6/uL (ref 3.77–5.28)
RDW: 13.1 % (ref 11.7–15.4)
WBC: 5.1 10*3/uL (ref 3.4–10.8)

## 2022-11-29 LAB — BASIC METABOLIC PANEL
BUN/Creatinine Ratio: 16 (ref 12–28)
BUN: 12 mg/dL (ref 8–27)
CO2: 24 mmol/L (ref 20–29)
Calcium: 9.9 mg/dL (ref 8.7–10.3)
Chloride: 100 mmol/L (ref 96–106)
Creatinine, Ser: 0.76 mg/dL (ref 0.57–1.00)
Glucose: 89 mg/dL (ref 70–99)
Potassium: 3.8 mmol/L (ref 3.5–5.2)
Sodium: 142 mmol/L (ref 134–144)
eGFR: 78 mL/min/{1.73_m2} (ref >59)

## 2022-12-04 ENCOUNTER — Telehealth: Payer: Self-pay | Admitting: Cardiology

## 2022-12-04 NOTE — Telephone Encounter (Signed)
Office calling to see if they can start the patient on Crestor 5mg  3 times a week. Please advise  She did state its okay to leave a VM she hasn't went on maternity leave yet.

## 2022-12-05 ENCOUNTER — Telehealth: Payer: Self-pay

## 2022-12-05 MED ORDER — APIXABAN 5 MG PO TABS: 5.0000 mg | ORAL_TABLET | Freq: Two times a day (BID) | ORAL | 6 refills | Status: DC

## 2022-12-05 NOTE — Telephone Encounter (Signed)
Left message on My Chart with normal results per Dr. Krasowski's note. Routed to PCP. 

## 2022-12-07 ENCOUNTER — Telehealth: Payer: Self-pay

## 2022-12-07 NOTE — Telephone Encounter (Signed)
Pt viewed results in My Chart per Dr. Krasowski's note. Routed to PCP.  

## 2022-12-11 DIAGNOSIS — I4891 Unspecified atrial fibrillation: Secondary | ICD-10-CM | POA: Diagnosis not present

## 2022-12-11 DIAGNOSIS — K219 Gastro-esophageal reflux disease without esophagitis: Secondary | ICD-10-CM | POA: Diagnosis not present

## 2022-12-11 DIAGNOSIS — K5790 Diverticulosis of intestine, part unspecified, without perforation or abscess without bleeding: Secondary | ICD-10-CM | POA: Diagnosis not present

## 2022-12-11 DIAGNOSIS — D519 Vitamin B12 deficiency anemia, unspecified: Secondary | ICD-10-CM | POA: Diagnosis not present

## 2022-12-11 DIAGNOSIS — K31819 Angiodysplasia of stomach and duodenum without bleeding: Secondary | ICD-10-CM | POA: Diagnosis not present

## 2022-12-11 DIAGNOSIS — I714 Abdominal aortic aneurysm, without rupture, unspecified: Secondary | ICD-10-CM | POA: Diagnosis not present

## 2022-12-11 DIAGNOSIS — R1310 Dysphagia, unspecified: Secondary | ICD-10-CM | POA: Diagnosis not present

## 2022-12-11 DIAGNOSIS — K644 Residual hemorrhoidal skin tags: Secondary | ICD-10-CM | POA: Diagnosis not present

## 2022-12-11 NOTE — Telephone Encounter (Signed)
Recommendations reviewed with Morrie Sheldon as per Dr. Vanetta Shawl note.  Richard verbalized understanding and had no additional questions.

## 2022-12-17 ENCOUNTER — Other Ambulatory Visit: Payer: Self-pay | Admitting: Cardiology

## 2022-12-20 ENCOUNTER — Telehealth: Payer: Self-pay | Admitting: Cardiology

## 2022-12-20 MED ORDER — APIXABAN 5 MG PO TABS: 5.0000 mg | ORAL_TABLET | Freq: Two times a day (BID) | ORAL | 1 refills | Status: DC

## 2022-12-20 NOTE — Telephone Encounter (Signed)
*  STAT* If patient is at the pharmacy, call can be transferred to refill team.   1. Which medications need to be refilled? (please list name of each medication and dose if known)   apixaban (ELIQUIS) 5 MG TABS tablet   2. Which pharmacy/location (including street and city if local pharmacy) is medication to be sent to?  [facility] - [place], [place] - [address]   3. Do they need a 30 day or 90 day supply?   90   Patient stated she still has medication left.  Patient wants prescription transferred from [facility] to [facility].

## 2022-12-20 NOTE — Telephone Encounter (Signed)
Prescription refill request for Eliquis received. Indication: PAF Last office visit: 11/28/22  Kandyce Rud MD Scr: 0.76 on 11/28/22  Epic Age: 82 Weight: 90.9kg  Based on above findings Eliquis 5mg  twice daily is the appropriate dose.  Refill approved.

## 2023-01-07 DIAGNOSIS — E538 Deficiency of other specified B group vitamins: Secondary | ICD-10-CM | POA: Diagnosis not present

## 2023-02-11 DIAGNOSIS — D519 Vitamin B12 deficiency anemia, unspecified: Secondary | ICD-10-CM | POA: Diagnosis not present

## 2023-02-28 ENCOUNTER — Ambulatory Visit: Payer: Medicare Other | Attending: Cardiology | Admitting: Cardiology

## 2023-02-28 ENCOUNTER — Encounter: Payer: Self-pay | Admitting: Cardiology

## 2023-02-28 VITALS — BP 132/74 | HR 77 | Ht 68.0 in | Wt 199.4 lb

## 2023-02-28 DIAGNOSIS — R0609 Other forms of dyspnea: Secondary | ICD-10-CM | POA: Diagnosis not present

## 2023-02-28 DIAGNOSIS — I7121 Aneurysm of the ascending aorta, without rupture: Secondary | ICD-10-CM | POA: Diagnosis not present

## 2023-02-28 DIAGNOSIS — E785 Hyperlipidemia, unspecified: Secondary | ICD-10-CM | POA: Diagnosis not present

## 2023-02-28 DIAGNOSIS — I472 Ventricular tachycardia, unspecified: Secondary | ICD-10-CM | POA: Diagnosis not present

## 2023-02-28 DIAGNOSIS — I441 Atrioventricular block, second degree: Secondary | ICD-10-CM | POA: Diagnosis not present

## 2023-02-28 NOTE — Addendum Note (Signed)
Addended by: Baldo Ash D on: 02/28/2023 11:47 AM   Modules accepted: Orders

## 2023-02-28 NOTE — Progress Notes (Signed)
Cardiology Office Note:    Date:  02/28/2023   ID:  Debra Harrison, DOB 22-Feb-1941, MRN [id number]  PCP:  Street, [name], MD  Cardiologist:  Gypsy Balsam, MD    Referring MD: Street, [name], *   Chief Complaint  Patient presents with   BP log    History of Present Illness:    Debra Harrison is a 82 y.o. female with past medical history significant for supraventricular tachycardia, atrial flutter, paroxysmal atrial fibrillation, ventricular tachycardia, chronic kidney disease, essential hypertension, obstructive sleep apnea, enlargement of the ascending aorta up to 44 mm. She comes today to months for follow-up.  Overall she seems to be doing well previously we have trial of amiodarone to stop trying to suppress her arrhythmia however she was unable to tolerate this because of neuropathy.  She is anticoagulated tolerating this without difficulties now.  Denies have any chest pain tightness squeezing pressure burning chest no palpitations overall seems to be doing well.  She was put by her primary care physician small dose of statin she takes Crestor twice a week higher dose 3 times a week gave her significant neuropathy and she stopped it.  Past Medical History:  Diagnosis Date   Abnormal electrocardiogram (ECG) (EKG) 09/18/2017   Acid reflux    Adult-onset obesity 09/09/2017   Ankle edema 09/09/2017   APC (atrial premature contractions) 09/18/2017   Benign essential hypertension 09/09/2017   Benign paroxysmal vertigo 09/09/2017   Bilateral carotid bruits 08/28/2016   Borderline hyperglycemia 09/09/2017   Cervical radiculopathy 08/28/2016   Chest pain 09/09/2017   Chronic renal disease, stage III (HCC) 09/09/2017   Degenerative arthritis of hip    Dyslipidemia 09/09/2017   Dysphagia 09/09/2017   GERD (gastroesophageal reflux disease) 09/09/2017   Hypercholesterolemia    Hypertension    Idiopathic peripheral neuropathy 08/28/2016   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 Mardene Speak   TOTAL HIP ARTHROPLASTY Right 02/01/2008    Current Medications: Current Meds  Medication Sig   apixaban (ELIQUIS) 5 MG TABS tablet Take 1 tablet (5 mg total) by mouth 2 (two) times daily.   furosemide (LASIX) 40 MG tablet Take 40 mg by mouth daily. Cana take 80 mg daily if needed for fluid   losartan (COZAAR) 100 MG tablet Take 100 mg by mouth daily.   metoprolol succinate (TOPROL XL) 25 MG 24 hr tablet Take 1 tablet (25 mg total) by mouth daily.   omeprazole (PRILOSEC) 40 MG capsule Take 40 mg by mouth daily as needed (heartburmn/acid reflux).   rosuvastatin (CRESTOR) 5 MG tablet Take 5 mg by mouth 2 (two) times a week.     Allergies:   Methocarbamol, Tetracyclines & related, Statins, Tetracycline, Zetia [ezetimibe], and Ace inhibitors   Social History   Socioeconomic History   Marital status: Widowed    Spouse name: Not  on file   Number of children: Not on file   Years of education: Not on file   Highest education level: Not on file  Occupational History   Not on file  Tobacco Use   Smoking status: Former    Current packs/day: 0.00    Types: Cigarettes    Start date: 15    Quit date: 15    Years since quitting: 37.6   Smokeless tobacco: Never  Vaping Use   Vaping status: Never Used  Substance and Sexual Activity   Alcohol use: Yes    Comment: occasionally, on a less  than daily basis   Drug use: No   Sexual activity: Not on file  Other Topics Concern   Not on file  Social History Narrative   Not on file   Social Determinants of Health   Financial Resource Strain: Not on file  Food Insecurity: Not on file  Transportation Needs: Not on file  Physical Activity: Not on file  Stress: Not on file  Social Connections: Not on file     Family History: The patient's family history includes AAA (abdominal aortic aneurysm) in her brother; Colon cancer in her mother; Congestive Heart Failure in her mother; Diabetes in her father and mother; Heart disease in her brother and father; Heart failure in her father; Hypertension in her father and mother; Irregular heart beat in her brother; Leukemia in her brother; Thyroid disease in an other family member. ROS:   Please see the history of present illness.    All 14 point review of systems negative except as described per history of present illness  EKGs/Labs/Other Studies Reviewed:         Recent Labs: 11/28/2022: BUN 12; Creatinine, Ser 0.76; Hemoglobin 13.9; Platelets 277; Potassium 3.8; Sodium 142  Recent Lipid Panel    Component Value Date/Time   CHOL 219 (H) 05/11/2022 0853   TRIG 110 05/11/2022 0853   HDL 53 05/11/2022 0853   CHOLHDL 4.1 05/11/2022 0853   LDLCALC 146 (H) 05/11/2022 0853    Physical Exam:    VS:  BP 132/74 (BP Location: Left Arm, Patient Position: Sitting)   Pulse 77   Ht 5\' 8"  (1.727 m)   Wt 199 lb 6.4 oz (90.4 kg)   SpO2 96%   BMI 30.32 kg/m     Wt Readings from Last 3 Encounters:  02/28/23 199 lb 6.4 oz (90.4 kg)  11/28/22 200 lb 6.4 oz (90.9 kg)  10/25/22 204 lb (92.5 kg)     GEN:  Well nourished, well developed in no acute distress HEENT: Normal NECK: No JVD; No carotid bruits LYMPHATICS: No lymphadenopathy CARDIAC: RRR, no murmurs, no rubs, no gallops RESPIRATORY:  Clear to auscultation without rales, wheezing or rhonchi  ABDOMEN: Soft, non-tender,  non-distended MUSCULOSKELETAL:  No edema; No deformity  SKIN: Warm and dry LOWER EXTREMITIES: no swelling NEUROLOGIC:  Alert and oriented x 3 PSYCHIATRIC:  Normal affect   ASSESSMENT:    1. Aneurysm of ascending aorta without rupture (HCC)   2. Ventricular tachycardia (HCC)   3. Second degree heart block   4. Dyslipidemia    PLAN:    In order of problems listed above:  Aneurysm of the ascending aorta will repeat ultrasounds of her chest. Ventricular tachycardia denies have any palpitation she is on beta-blocker which I will continue.  She will be referred back to EP team. Second-degree AV block denies having a problem from that point review no dizziness no passing out. Dyslipidemia  I did review K PN which show me her total cholesterol of 231 with HDL 51.  She tried a statin she tried said and now she is able to tolerate Crestor 5 mg twice a week.  Tomorrow she is going to her primary care physician to get fasting lipid profile done, will wait for results of this before committing her to PCSK9 agent which probably will be the way to go. Essential hypertension elevated in office but she brought a detailed log with at least 25 numbers from home and it is always good.  I suspect she does have some component of whitecoat hypertension   Medication Adjustments/Labs and Tests Ordered: Current medicines are reviewed at length with the patient today.  Concerns regarding medicines are outlined above.  No orders of the defined types were placed in this encounter.  Medication changes: No orders of the defined types were placed in this encounter.   Signed, Georgeanna Lea, MD, [facility] 02/28/2023 11:41 AM    [facility]

## 2023-02-28 NOTE — Patient Instructions (Signed)
Medication Instructions:  Your physician recommends that you continue on your current medications as directed. Please refer to the Current Medication list given to you today.  *If you need a refill on your cardiac medications before your next appointment, please call your pharmacy*   Lab Work: None Ordered If you have labs (blood work) drawn today and your tests are completely normal, you will receive your results only by: MyChart Message (if you have MyChart) OR A paper copy in the mail If you have any lab test that is abnormal or we need to change your treatment, we will call you to review the results.   Testing/Procedures: Your physician has requested that you have an echocardiogram. Echocardiography is a painless test that uses sound waves to create images of your heart. It provides your doctor with information about the size and shape of your heart and how well your heart's chambers and valves are working. This procedure takes approximately one hour. There are no restrictions for this procedure. Please do NOT wear cologne, perfume, aftershave, or lotions (deodorant is allowed). Please arrive 15 minutes prior to your appointment time.      Follow-Up: At [facility], you and your health needs are our priority.  As part of our continuing mission to provide you with exceptional heart care, we have created designated Provider Care Teams.  These Care Teams include your primary Cardiologist (physician) and Advanced Practice Providers (APPs -  Physician Assistants and Nurse Practitioners) who all work together to provide you with the care you need, when you need it.  We recommend signing up for the patient portal called "MyChart".  Sign up information is provided on this After Visit Summary.  MyChart is used to connect with patients for Virtual Visits (Telemedicine).  Patients are able to view lab/test results, encounter notes, upcoming appointments, etc.  Non-urgent messages can be sent to  your provider as well.   To learn more about what you can do with MyChart, go to [email or web address].    Your next appointment:   6 month(s)  The format for your next appointment:   In Person  Provider:   Robert Krasowski, MD    Other Instructions NA  

## 2023-03-01 DIAGNOSIS — E785 Hyperlipidemia, unspecified: Secondary | ICD-10-CM | POA: Diagnosis not present

## 2023-03-01 DIAGNOSIS — I4892 Unspecified atrial flutter: Secondary | ICD-10-CM | POA: Diagnosis not present

## 2023-03-01 DIAGNOSIS — D6869 Other thrombophilia: Secondary | ICD-10-CM | POA: Diagnosis not present

## 2023-03-01 DIAGNOSIS — G720 Drug-induced myopathy: Secondary | ICD-10-CM | POA: Diagnosis not present

## 2023-03-12 DIAGNOSIS — D519 Vitamin B12 deficiency anemia, unspecified: Secondary | ICD-10-CM | POA: Diagnosis not present

## 2023-03-28 ENCOUNTER — Encounter: Payer: Self-pay | Admitting: Cardiology

## 2023-04-02 ENCOUNTER — Ambulatory Visit: Payer: Medicare Other | Attending: Cardiology

## 2023-04-02 DIAGNOSIS — R0609 Other forms of dyspnea: Secondary | ICD-10-CM | POA: Insufficient documentation

## 2023-04-02 LAB — ECHOCARDIOGRAM COMPLETE
P 1/2 time: 548 ms
S' Lateral: 2.8 cm

## 2023-04-11 DIAGNOSIS — D519 Vitamin B12 deficiency anemia, unspecified: Secondary | ICD-10-CM | POA: Diagnosis not present

## 2023-04-12 ENCOUNTER — Telehealth: Payer: Self-pay

## 2023-04-12 NOTE — Telephone Encounter (Signed)
Patient returned RN's call and stated she is on her way out of town and will call back on Monday (9/30).

## 2023-04-12 NOTE — Telephone Encounter (Signed)
LVM to call regarding ECHO results

## 2023-04-15 ENCOUNTER — Telehealth: Payer: Self-pay

## 2023-04-15 NOTE — Telephone Encounter (Signed)
Echo Results reviewed with pt as per Dr. Krasowski's note.  Pt verbalized understanding and had no additional questions. Routed to PCP 

## 2023-04-15 NOTE — Telephone Encounter (Signed)
Patient is returning call and is requesting call back.  

## 2023-04-23 DIAGNOSIS — Z23 Encounter for immunization: Secondary | ICD-10-CM | POA: Diagnosis not present

## 2023-04-26 ENCOUNTER — Encounter: Payer: Self-pay | Admitting: Cardiology

## 2023-05-03 NOTE — Progress Notes (Signed)
 " Cardiology Office Note:  .   Date:  05/06/2023  ID:  Debra Harrison, DOB 03-30-1941, MRN [id number] PCP: Street, Lonni HERO, MD  [facility] Providers Cardiologist:  None Electrophysiologist:  Will Gladis Norton, MD    History of Present Illness: .   Debra Harrison is a 82 y.o. female with a past medical history of hypertension, second-degree AV block, APCs, SVT, ascending aortic aneurysm, NSVT, PAF on Eliquis , GERD, CKD stage III, dyslipidemia.  04/02/2023 echo EF 60 to 65%, grade 1 DD, LA mildly dilated, mild MR, mild AR, severe dilatation of the ascending aorta 45 mm 05/21/2022 monitor average heart rate 61 bpm, predominant rhythm was sinus, 1 run of NSVT for 6 beats, 19 episodes of SVT atrial fibrillation occurred 1% of the time 06/13/2021 Lexiscan  negative for ischemia  Most recently evaluated by Dr. Bernie on 02/28/2023, she was doing okay with from an atrial fibrillation perspective, maintaining sinus rhythm.  Previously had tried amiodarone  however she was unable to tolerate this secondary to neuropathy.  She presents today after contacting our triage with concerns of swelling of both ankles, had an episode where her heart rate sped up to 155 and then drop down to 30.  She has not had any other recurrent episodes since then.  She does endorse a high level of personal stress, she feels this might have been attributing to it.  She also has been bothered by pedal edema, saw her PCP for this and he increased her Lasix for 5 days which did help with her edema however it returned after diuresing. She denies chest pain, palpitations, dyspnea, pnd, orthopnea, n, v, dizziness, syncope, weight gain, or early satiety.    ROS: Review of Systems  Constitutional: Negative.   HENT:  Positive for hearing loss.   Eyes: Negative.   Respiratory: Negative.    Cardiovascular:  Positive for palpitations and leg swelling.  Gastrointestinal: Negative.   Genitourinary: Negative.    Musculoskeletal: Negative.   Skin: Negative.   Neurological: Negative.   Endo/Heme/Allergies:  Bruises/bleeds easily.  Psychiatric/Behavioral: Negative.       Studies Reviewed: SABRA   EKG Interpretation Date/Time:  Monday May 06 2023 10:08:30 EDT Ventricular Rate:  58 PR Interval:  156 QRS Duration:  98 QT Interval:  446 QTC Calculation: 437 R Axis:   9  Text Interpretation: Sinus bradycardia with occasional Premature ventricular complexes Cannot rule out Anterior infarct (cited on or before 13-Jun-2021) Abnormal ECG When compared with ECG of 13-Jun-2021 12:22, No significant change was found Confirmed by Carlin Nest [phone]) on 05/06/2023 10:08:56 AM    Cardiac Studies & Procedures     STRESS TESTS  MYOCARDIAL PERFUSION IMAGING 06/13/2021  Narrative   The study is normal. The study is low risk.   Left ventricular function is normal. Nuclear stress EF: 68 %. The left ventricular ejection fraction is hyperdynamic (>65%). End diastolic cavity size is normal.   Prior study not available for comparison.   ECHOCARDIOGRAM  ECHOCARDIOGRAM COMPLETE 04/02/2023  Narrative ECHOCARDIOGRAM REPORT    Patient Name:   Debra Harrison Date of Exam: 04/02/2023 Medical Rec #:  [id number]      Height:       68.0 in Accession #:    [id number]     Weight:       199.4 lb Date of Birth:  04-Feb-1941      BSA:          2.041 m Patient Age:    33  years       BP:           132/74 mmHg Patient Gender: F              HR:           82 bpm. Exam Location:  [facility]  Procedure: 2D Echo, Cardiac Doppler, Color Doppler and Strain Analysis  Indications:    Dyspnea on exertion [R06.09 (ICD-10-CM)]  History:        Patient has prior history of Echocardiogram examinations, most recent 04/03/2022. Signs/Symptoms:Chest Pain, Edema and Dizziness/Lightheadedness; Risk Factors:Hypertension and Dyslipidemia.  Sonographer:    Lynwood Silvas RDCS Referring Phys: [phone] LAMAR PARAS  KRASOWSKI  IMPRESSIONS   1. Left ventricular ejection fraction, by estimation, is 60 to 65%. The left ventricle has normal function. The left ventricle has no regional wall motion abnormalities. Left ventricular diastolic parameters are consistent with Grade I diastolic dysfunction (impaired relaxation). 2. Right ventricular systolic function is normal. The right ventricular size is normal. There is normal pulmonary artery systolic pressure. 3. Left atrial size was mildly dilated. 4. The mitral valve is degenerative. Mild mitral valve regurgitation. No evidence of mitral stenosis. 5. The aortic valve is tricuspid. Aortic valve regurgitation is mild. Aortic valve sclerosis is present, with no evidence of aortic valve stenosis. 6. Aortic Normal DTA. There is severe dilatation of the ascending aorta, measuring 45 mm. 7. The inferior vena cava is normal in size with greater than 50% respiratory variability, suggesting right atrial pressure of 3 mmHg.  FINDINGS Left Ventricle: Left ventricular ejection fraction, by estimation, is 60 to 65%. The left ventricle has normal function. The left ventricle has no regional wall motion abnormalities. The left ventricular internal cavity size was normal in size. There is no left ventricular hypertrophy. Left ventricular diastolic parameters are consistent with Grade I diastolic dysfunction (impaired relaxation). Indeterminate filling pressures.  Right Ventricle: The right ventricular size is normal. No increase in right ventricular wall thickness. Right ventricular systolic function is normal. There is normal pulmonary artery systolic pressure. The tricuspid regurgitant velocity is 2.39 m/s, and with an assumed right atrial pressure of 3 mmHg, the estimated right ventricular systolic pressure is 25.8 mmHg.  Left Atrium: Left atrial size was mildly dilated.  Right Atrium: Right atrial size was normal in size.  Pericardium: There is no evidence of pericardial  effusion.  Mitral Valve: The mitral valve is degenerative in appearance. Mild mitral annular calcification. Mild mitral valve regurgitation. No evidence of mitral valve stenosis.  Tricuspid Valve: The tricuspid valve is normal in structure. Tricuspid valve regurgitation is mild . No evidence of tricuspid stenosis.  Aortic Valve: The aortic valve is tricuspid. Aortic valve regurgitation is mild. Aortic regurgitation PHT measures 548 msec. Aortic valve sclerosis is present, with no evidence of aortic valve stenosis.  Pulmonic Valve: The pulmonic valve was normal in structure. Pulmonic valve regurgitation is mild. No evidence of pulmonic stenosis.  Aorta: The aortic root is normal in size and structure, the aortic arch was not well visualized and Normal DTA. There is severe dilatation of the ascending aorta, measuring 45 mm.  Venous: The pulmonary veins were not well visualized. The inferior vena cava is normal in size with greater than 50% respiratory variability, suggesting right atrial pressure of 3 mmHg.  IAS/Shunts: No atrial level shunt detected by color flow Doppler.   LEFT VENTRICLE PLAX 2D LVIDd:         4.20 cm   Diastology LVIDs:  2.80 cm   LV e' medial:    5.22 cm/s LV PW:         1.45 cm   LV E/e' medial:  13.9 LV IVS:        1.70 cm   LV e' lateral:   7.83 cm/s LVOT diam:     1.85 cm   LV E/e' lateral: 9.3 LV SV:         93 LV SV Index:   46 LVOT Area:     2.69 cm   RIGHT VENTRICLE             IVC RV Basal diam:  2.90 cm     IVC diam: 1.90 cm RV S prime:     12.30 cm/s TAPSE (M-mode): 2.2 cm  LEFT ATRIUM             Index        RIGHT ATRIUM           Index LA diam:        4.20 cm 2.06 cm/m   RA Area:     12.70 cm LA Vol (A2C):   79.3 ml 38.85 ml/m  RA Volume:   31.70 ml  15.53 ml/m LA Vol (A4C):   67.9 ml 33.27 ml/m LA Biplane Vol: 76.1 ml 37.28 ml/m AORTIC VALVE LVOT Vmax:   154.00 cm/s LVOT Vmean:  107.500 cm/s LVOT VTI:    0.347 m AI PHT:       548 msec  AORTA Ao Root diam: 3.50 cm Ao Asc diam:  4.45 cm Ao Desc diam: 2.80 cm  MV E velocity: 72.67 cm/s  TRICUSPID VALVE MV A velocity: 98.60 cm/s  TR Peak grad:   22.8 mmHg MV E/A ratio:  0.74        TR Vmax:        239.00 cm/s  SHUNTS Systemic VTI:  0.35 m Systemic Diam: 1.85 cm  Redell Leiter MD Electronically signed by Redell Leiter MD Signature Date/Time: 04/02/2023/5:35:09 PM    Final    MONITORS  LONG TERM MONITOR (3-14 DAYS) 06/13/2022  Narrative Patch Wear Time:  13 days and 23 hours (2023-11-06T08:51:53-0500 to 2023-11-20T08:51:45-0500)  Patient had a min HR of 42 bpm, max HR of 190 bpm, and avg HR of 61 bpm. Predominant underlying rhythm was Sinus Rhythm. Bundle Branch Block/IVCD was present. 1 run of Ventricular Tachycardia occurred lasting 6 beats with a max rate of 113 bpm (avg 100 bpm). 19 Supraventricular Tachycardia runs occurred, the run with the fastest interval lasting 7 beats with a max rate of 190 bpm, the longest lasting 18.5 secs with an avg rate of 103 bpm. Atrial Fibrillation occurred (1% burden), ranging from 76-164 bpm (avg of 112 bpm), the longest lasting 3 hours 27 mins with an avg rate of 110 bpm. Some episodes of Supraventricular Tachycardia may be possible Atrial Tachycardia with variable block. Isolated SVEs were occasional (2.0%, [id number]), SVE Couplets were rare (<1.0%, 796), and SVE Triplets were rare (<1.0%, 171). Isolated VEs were rare (<1.0%, 9383), VE Couplets were rare (<1.0%, 294), and VE Triplets were rare (<1.0%, 30). Ventricular Bigeminy and Trigeminy were present.  Summary conclusions: Episode of atrial fibrillation noted, total burden 1%, heart rate between 76 and 164. 1 episode of ventricle tachycardia 6 beats at rate 113. 19 episodes of supraventricular tachycardias.           Risk Assessment/Calculations:    CHA2DS2-VASc Score = 4   This indicates a 4.8% annual  risk of stroke. The patient's score is based upon: CHF  History: 0 HTN History: 1 Diabetes History: 0 Stroke History: 0 Vascular Disease History: 0 Age Score: 2 Gender Score: 1        STOP-Bang Score:  4      Physical Exam:   VS:  BP 128/68   Pulse (!) 58   Ht 5' 8 (1.727 m)   Wt 202 lb (91.6 kg)   SpO2 96%   BMI 30.71 kg/m    Wt Readings from Last 3 Encounters:  05/06/23 202 lb (91.6 kg)  02/28/23 199 lb 6.4 oz (90.4 kg)  11/28/22 200 lb 6.4 oz (90.9 kg)    GEN: Well nourished, well developed in no acute distress NECK: No JVD; No carotid bruits CARDIAC: RRR, no murmurs, rubs, gallops RESPIRATORY:  Clear to auscultation without rales, wheezing or rhonchi  ABDOMEN: Soft, non-tender, non-distended EXTREMITIES:  No edema appreciated today but she does have varicose veins; No deformity   ASSESSMENT AND PLAN: .   Paroxysmal atrial fibrillation/hypercoagulable state-CHA2DS2-VASc score of 4, she is maintaining sinus rhythm today, 1 episode as outlined above in the HPI that sounds to be most consistent with atrial fibrillation.  We did discuss wearing a monitor again although not sure this will give us  any new information.  Will give her a short acting Cardizem  to take if her heart rate exceeds 120.  Will check TSH, magnesium and electrolytes.  Continue Eliquis  5 mg twice daily discharged medication for dose reduction, continue metoprolol  25 mg daily.  Will start Cardizem  30 mg as needed for heart rate greater than 120.  Hypertension-blood pressure is well-controlled today at 128/68, continue Cozaar 100 mg daily.  Pedal edema/venous insufficiency-most recent echocardiogram was unrevealing for contributory causes of heart failure.  She does have varicose veins, has a history of working on her feet.  Will check proBNP and kidney function, if this is well we will plan to increase her diuretic.  Ascending aorta aneurysm-45 mm on most recent imaging per echocardiogram, continue with good blood pressure control, she is a former smoker.         Dispo: Labs per above, Start Cardizem  30 mg PRN for HR > 120. Follow up with Dr. Krasowski in 6 weeks.   Signed, Delon JAYSON Hoover, NP  "

## 2023-05-06 ENCOUNTER — Ambulatory Visit: Payer: Medicare Other | Attending: Cardiology | Admitting: Cardiology

## 2023-05-06 ENCOUNTER — Encounter: Payer: Self-pay | Admitting: Cardiology

## 2023-05-06 VITALS — BP 128/68 | HR 58 | Ht 68.0 in | Wt 202.0 lb

## 2023-05-06 DIAGNOSIS — I872 Venous insufficiency (chronic) (peripheral): Secondary | ICD-10-CM | POA: Diagnosis present

## 2023-05-06 DIAGNOSIS — I7121 Aneurysm of the ascending aorta, without rupture: Secondary | ICD-10-CM

## 2023-05-06 DIAGNOSIS — R0609 Other forms of dyspnea: Secondary | ICD-10-CM

## 2023-05-06 DIAGNOSIS — I4892 Unspecified atrial flutter: Secondary | ICD-10-CM | POA: Diagnosis present

## 2023-05-06 DIAGNOSIS — R600 Localized edema: Secondary | ICD-10-CM

## 2023-05-06 DIAGNOSIS — I471 Supraventricular tachycardia, unspecified: Secondary | ICD-10-CM

## 2023-05-06 MED ORDER — DILTIAZEM HCL 30 MG PO TABS: 30.0000 mg | ORAL_TABLET | Freq: Every day | ORAL | 3 refills | Status: AC | PRN

## 2023-05-06 MED ORDER — DILTIAZEM HCL 30 MG PO TABS: 30.0000 mg | ORAL_TABLET | Freq: Every day | ORAL | 3 refills | Status: DC | PRN

## 2023-05-06 NOTE — Patient Instructions (Signed)
Medication Instructions:  Your physician has recommended you make the following change in your medication:  Start Cardizem 30 mg once daily as needed for Heart Rate higher than 120 bpm  *If you need a refill on your cardiac medications before your next appointment, please call your pharmacy*   Lab Work: Your physician recommends that you return for lab work in: Today for CMP, TSH, Magnesium and ProBNP  If you have labs (blood work) drawn today and your tests are completely normal, you will receive your results only by: MyChart Message (if you have MyChart) OR A paper copy in the mail If you have any lab test that is abnormal or we need to change your treatment, we will call you to review the results.   Testing/Procedures: NONE   Follow-Up: At [facility], you and your health needs are our priority.  As part of our continuing mission to provide you with exceptional heart care, we have created designated Provider Care Teams.  These Care Teams include your primary Cardiologist (physician) and Advanced Practice Providers (APPs -  Physician Assistants and Nurse Practitioners) who all work together to provide you with the care you need, when you need it.  We recommend signing up for the patient portal called "MyChart".  Sign up information is provided on this After Visit Summary.  MyChart is used to connect with patients for Virtual Visits (Telemedicine).  Patients are able to view lab/test results, encounter notes, upcoming appointments, etc.  Non-urgent messages can be sent to your provider as well.   To learn more about what you can do with MyChart, go to [email or web address].    Your next appointment:   6 week(s)  Provider:   Gypsy Balsam, MD    Other Instructions

## 2023-05-07 LAB — COMPREHENSIVE METABOLIC PANEL WITH GFR
ALT: 16 IU/L (ref 0–32)
AST: 20 IU/L (ref 0–40)
Albumin: 4.1 g/dL (ref 3.7–4.7)
Alkaline Phosphatase: 97 IU/L (ref 44–121)
BUN/Creatinine Ratio: 23 (ref 12–28)
BUN: 16 mg/dL (ref 8–27)
Bilirubin Total: 0.3 mg/dL (ref 0.0–1.2)
CO2: 28 mmol/L (ref 20–29)
Calcium: 9.9 mg/dL (ref 8.7–10.3)
Chloride: 100 mmol/L (ref 96–106)
Creatinine, Ser: 0.7 mg/dL (ref 0.57–1.00)
Globulin, Total: 2.5 g/dL (ref 1.5–4.5)
Glucose: 101 mg/dL — ABNORMAL HIGH (ref 70–99)
Potassium: 4.2 mmol/L (ref 3.5–5.2)
Sodium: 141 mmol/L (ref 134–144)
Total Protein: 6.6 g/dL (ref 6.0–8.5)
eGFR: 86 mL/min/1.73

## 2023-05-07 LAB — TSH: TSH: 0.835 u[IU]/mL (ref 0.450–4.500)

## 2023-05-07 LAB — MAGNESIUM: Magnesium: 1.8 mg/dL (ref 1.6–2.3)

## 2023-05-07 LAB — PRO B NATRIURETIC PEPTIDE: NT-Pro BNP: 391 pg/mL (ref 0–738)

## 2023-05-13 DIAGNOSIS — D519 Vitamin B12 deficiency anemia, unspecified: Secondary | ICD-10-CM | POA: Diagnosis not present

## 2023-05-24 ENCOUNTER — Telehealth: Payer: Self-pay | Admitting: Cardiology

## 2023-05-24 DIAGNOSIS — R2243 Localized swelling, mass and lump, lower limb, bilateral: Secondary | ICD-10-CM | POA: Diagnosis not present

## 2023-05-24 NOTE — Telephone Encounter (Signed)
Office calling to speak with provider or DOD regarding pt who is currently in their office. Call transferred to Dr. Kirtland Bouchard.

## 2023-05-24 NOTE — Telephone Encounter (Signed)
Dr. Bing Matter spoke with PA

## 2023-05-31 ENCOUNTER — Other Ambulatory Visit: Payer: Self-pay | Admitting: Cardiology

## 2023-05-31 DIAGNOSIS — I4892 Unspecified atrial flutter: Secondary | ICD-10-CM

## 2023-05-31 NOTE — Telephone Encounter (Signed)
Prescription refill request for Eliquis received. Indication: Afib  Last office visit: 05/06/23 Elliot Gurney)  Scr: 0.70 (05/06/23)  Age: 82 Weight: 91.6kg  Appropriate dose. Refill sent.

## 2023-06-07 DIAGNOSIS — G44209 Tension-type headache, unspecified, not intractable: Secondary | ICD-10-CM | POA: Diagnosis not present

## 2023-06-07 DIAGNOSIS — G8929 Other chronic pain: Secondary | ICD-10-CM | POA: Diagnosis not present

## 2023-06-07 DIAGNOSIS — M170 Bilateral primary osteoarthritis of knee: Secondary | ICD-10-CM | POA: Diagnosis not present

## 2023-06-07 DIAGNOSIS — E538 Deficiency of other specified B group vitamins: Secondary | ICD-10-CM | POA: Diagnosis not present

## 2023-06-07 DIAGNOSIS — G63 Polyneuropathy in diseases classified elsewhere: Secondary | ICD-10-CM | POA: Diagnosis not present

## 2023-06-18 ENCOUNTER — Ambulatory Visit: Payer: Medicare Other | Attending: Cardiology | Admitting: Cardiology

## 2023-06-18 ENCOUNTER — Encounter: Payer: Self-pay | Admitting: Cardiology

## 2023-06-18 VITALS — BP 158/86 | HR 76 | Ht 69.0 in | Wt 205.0 lb

## 2023-06-18 DIAGNOSIS — I441 Atrioventricular block, second degree: Secondary | ICD-10-CM | POA: Diagnosis not present

## 2023-06-18 DIAGNOSIS — E785 Hyperlipidemia, unspecified: Secondary | ICD-10-CM | POA: Insufficient documentation

## 2023-06-18 DIAGNOSIS — I4892 Unspecified atrial flutter: Secondary | ICD-10-CM | POA: Insufficient documentation

## 2023-06-18 DIAGNOSIS — I472 Ventricular tachycardia, unspecified: Secondary | ICD-10-CM | POA: Diagnosis not present

## 2023-06-18 DIAGNOSIS — R0609 Other forms of dyspnea: Secondary | ICD-10-CM | POA: Diagnosis not present

## 2023-06-18 DIAGNOSIS — I7121 Aneurysm of the ascending aorta, without rupture: Secondary | ICD-10-CM | POA: Diagnosis not present

## 2023-06-18 DIAGNOSIS — I471 Supraventricular tachycardia, unspecified: Secondary | ICD-10-CM | POA: Insufficient documentation

## 2023-06-18 NOTE — Progress Notes (Signed)
Cardiology Office Note:    Date:  06/18/2023   ID:  Debra Harrison, DOB 21-Feb-1941, MRN [id number]  PCP:  Street, [name], MD  Cardiologist:  Gypsy Balsam, MD    Referring MD: [address] [name], *   No chief complaint on file.   History of Present Illness:    Debra Harrison is a 82 y.o. female past medical history significant for essential hypertension, supraventricular tachycardia, paroxysmal atrial fibrillation/flutter on Eliquis, history of nonsustained ventricular tachycardia, ascending aortic aneurysm measuring 45 mm based on echocardiogram from April 02, 2023, chronic swelling of lower extremities.  Comes today to months for follow-up.  She answered today that she is moving to a different town and discussed last time she see me.  Overall she says she is doing well.  She is very excited about the fact that she is moving and very optimistic with her future.  Denies have any chest pain tightness squeezing pressure burning chest.  Rare episode of palpitations in the matter-of-fact 1 night she woke up her heart was speeding up.  She keep taking Eliquis on the regular basis.  She was scheduled to see our EP team however it never happened.  Past Medical History:  Diagnosis Date   Abnormal electrocardiogram (ECG) (EKG) 09/18/2017   Acid reflux    Adult-onset obesity 09/09/2017   Ankle edema 09/09/2017   APC (atrial premature contractions) 09/18/2017   Benign essential hypertension 09/09/2017   Benign paroxysmal vertigo 09/09/2017   Bilateral carotid bruits 08/28/2016   Borderline hyperglycemia 09/09/2017   Cervical radiculopathy 08/28/2016   Chest pain 09/09/2017   Chronic renal disease, stage III (HCC) 09/09/2017   Degenerative arthritis of hip    Dyslipidemia 09/09/2017   Dysphagia 09/09/2017   GERD (gastroesophageal reflux disease) 09/09/2017   Hypercholesterolemia    Hypertension    Idiopathic peripheral neuropathy 08/28/2016   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 Mardene Speak   TOTAL HIP ARTHROPLASTY Right 02/01/2008    Current Medications: Current Meds  Medication Sig   apixaban (ELIQUIS) 5 MG TABS tablet TAKE 1 TABLET TWICE A DAY   diltiazem (CARDIZEM) 30 MG tablet Take 1 tablet (30 mg total) by mouth daily as needed. Once daily as needed for Heart rate higher than 120 bpm   furosemide (LASIX) 40 MG tablet Take 40 mg by mouth daily. Cana take 80 mg daily if needed for fluid   losartan (COZAAR) 100 MG tablet Take 100 mg by mouth daily.   metoprolol succinate (TOPROL XL) 25 MG 24 hr tablet Take 1 tablet (25 mg total) by mouth daily.   omeprazole (PRILOSEC) 40 MG capsule Take 40 mg by mouth daily as needed (heartburmn/acid reflux).   rosuvastatin (CRESTOR) 5 MG tablet Take 5 mg by mouth 2 (two) times a week.   traMADol (ULTRAM) 50 MG tablet Take 50 mg by  mouth 3 (three) times daily as needed for moderate pain (pain score 4-6) or severe pain (pain score 7-10).     Allergies:   Methocarbamol, Tetracyclines & related, Statins, Tetracycline, Zetia [ezetimibe], and Ace inhibitors   Social History   Socioeconomic History   Marital status: Widowed    Spouse name: Not on file   Number of children: Not on file   Years of education: Not on file   Highest education level: Not on file  Occupational History   Not on file  Tobacco Use   Smoking status: Former    Current  packs/day: 0.00    Types: Cigarettes    Start date: 82    Quit date: 72    Years since quitting: 37.9   Smokeless tobacco: Never  Vaping Use   Vaping status: Never Used  Substance and Sexual Activity   Alcohol use: Yes    Comment: occasionally, on a less than daily basis   Drug use: No   Sexual activity: Not on file  Other Topics Concern   Not on file  Social History Narrative   Not on file   Social Determinants of Health   Financial Resource Strain: Not on file  Food Insecurity: Not on file  Transportation Needs: Not on file  Physical Activity: Not on file  Stress: Not on file  Social Connections: Not on file     Family History: The patient's family history includes AAA (abdominal aortic aneurysm) in her brother; Colon cancer in her mother; Congestive Heart Failure in her mother; Diabetes in her father and mother; Heart disease in her brother and father; Heart failure in her father; Hypertension in her father and mother; Irregular heart beat in her brother; Leukemia in her brother; Thyroid disease in an other family member. ROS:   Please see the history of present illness.    All 14 point review of systems negative except as described per history of present illness  EKGs/Labs/Other Studies Reviewed:         Recent Labs: 11/28/2022: Hemoglobin 13.9; Platelets 277 05/06/2023: ALT 16; BUN 16; Creatinine, Ser 0.70; Magnesium 1.8; NT-Pro BNP 391; Potassium 4.2; Sodium 141; TSH 0.835  Recent Lipid Panel    Component Value Date/Time   CHOL 219 (H) 05/11/2022 0853   TRIG 110 05/11/2022 0853   HDL 53 05/11/2022 0853   CHOLHDL 4.1 05/11/2022 0853   LDLCALC 146 (H) 05/11/2022 0853    Physical Exam:    VS:  BP (!) 158/86   Pulse 76   Ht 5\' 9"  (1.753 m)   Wt 205 lb (93 kg)   SpO2 98%   BMI 30.27 kg/m     Wt Readings from Last 3 Encounters:  06/18/23 205 lb (93 kg)  05/06/23 202 lb (91.6 kg)  02/28/23 199 lb 6.4 oz (90.4 kg)     GEN:  Well nourished, well  developed in no acute distress HEENT: Normal NECK: No JVD; No carotid bruits LYMPHATICS: No lymphadenopathy CARDIAC: RRR, no murmurs, no rubs, no gallops RESPIRATORY:  Clear to auscultation without rales, wheezing or rhonchi  ABDOMEN: Soft, non-tender, non-distended MUSCULOSKELETAL:  No edema; No deformity  SKIN: Warm and dry LOWER EXTREMITIES: no swelling NEUROLOGIC:  Alert and oriented x 3 PSYCHIATRIC:  Normal affect   ASSESSMENT:    1. Aneurysm of ascending aorta without rupture (HCC)   2. Supraventricular tachycardia (HCC)   3. Ventricular tachycardia (HCC)   4. Paroxysmal atrial flutter (HCC)   5. Second degree heart block  6. Dyslipidemia   7. Dyspnea on exertion    PLAN:    In order of problems listed above:  Aneurysm of the ascending aorta 45 mm.  Required periodic checks.  I told her that we are not going to do anything unless it is related to the size of 55 mm all quickly grows. History of ventricular tachycardia.  No dizziness no passing out.  She takes small dose of beta-blocker I cannot increase the dose of the medication secondary to the fact that she does have second-degree type I AV block during the night.  She was referred to our EP team for some reason did not happen.  She is moving out of the time then she may benefit from seeing EP team there. Paroxysmal atrial flutter/fibrillation.  Rare episodes I told her if that became more frequent we need to think about more advanced way to manage this problem which may include ablation versus antiarrhythmic therapy with difficulty because of tacky bradycardia phenomenon. Dyslipidemia I did review K PN however I have data from last year.  I have information about August data with total cholesterol 199 HDL 52 I do not LDL.  Will call primary care physician to get a copy of it   Medication Adjustments/Labs and Tests Ordered: Current medicines are reviewed at length with the patient today.  Concerns regarding medicines are  outlined above.  No orders of the defined types were placed in this encounter.  Medication changes: No orders of the defined types were placed in this encounter.   Signed, Georgeanna Lea, MD, [facility] 06/18/2023 10:40 AM    [facility]

## 2023-06-18 NOTE — Patient Instructions (Signed)
Medication Instructions:  Your physician recommends that you continue on your current medications as directed. Please refer to the Current Medication list given to you today.  *If you need a refill on your cardiac medications before your next appointment, please call your pharmacy*   Lab Work: None Ordered If you have labs (blood work) drawn today and your tests are completely normal, you will receive your results only by: MyChart Message (if you have MyChart) OR A paper copy in the mail If you have any lab test that is abnormal or we need to change your treatment, we will call you to review the results.   Testing/Procedures: None Ordered   Follow-Up: At [facility], you and your health needs are our priority.  As part of our continuing mission to provide you with exceptional heart care, we have created designated Provider Care Teams.  These Care Teams include your primary Cardiologist (physician) and Advanced Practice Providers (APPs -  Physician Assistants and Nurse Practitioners) who all work together to provide you with the care you need, when you need it.  We recommend signing up for the patient portal called "MyChart".  Sign up information is provided on this After Visit Summary.  MyChart is used to connect with patients for Virtual Visits (Telemedicine).  Patients are able to view lab/test results, encounter notes, upcoming appointments, etc.  Non-urgent messages can be sent to your provider as well.   To learn more about what you can do with MyChart, go to [email or web address].    Your next appointment:   Follow up as needed  The format for your next appointment:   In Person  Provider:   Robert Krasowski, MD    Other Instructions NA  

## 2023-07-16 DIAGNOSIS — I4891 Unspecified atrial fibrillation: Secondary | ICD-10-CM | POA: Diagnosis not present

## 2023-07-16 DIAGNOSIS — R002 Palpitations: Secondary | ICD-10-CM | POA: Diagnosis not present

## 2023-07-16 DIAGNOSIS — R079 Chest pain, unspecified: Secondary | ICD-10-CM | POA: Diagnosis not present

## 2023-07-16 DIAGNOSIS — I10 Essential (primary) hypertension: Secondary | ICD-10-CM | POA: Diagnosis not present

## 2023-07-16 DIAGNOSIS — Z90710 Acquired absence of both cervix and uterus: Secondary | ICD-10-CM | POA: Diagnosis not present

## 2023-07-19 DIAGNOSIS — I7121 Aneurysm of the ascending aorta, without rupture: Secondary | ICD-10-CM | POA: Diagnosis not present

## 2023-07-19 DIAGNOSIS — I4891 Unspecified atrial fibrillation: Secondary | ICD-10-CM | POA: Diagnosis not present

## 2023-08-08 DIAGNOSIS — I7121 Aneurysm of the ascending aorta, without rupture: Secondary | ICD-10-CM | POA: Diagnosis not present

## 2023-08-08 DIAGNOSIS — I4891 Unspecified atrial fibrillation: Secondary | ICD-10-CM | POA: Diagnosis not present

## 2023-08-09 DIAGNOSIS — I7121 Aneurysm of the ascending aorta, without rupture: Secondary | ICD-10-CM | POA: Diagnosis not present

## 2023-08-09 DIAGNOSIS — I4891 Unspecified atrial fibrillation: Secondary | ICD-10-CM | POA: Diagnosis not present

## 2023-09-06 DIAGNOSIS — I4892 Unspecified atrial flutter: Secondary | ICD-10-CM | POA: Diagnosis present

## 2023-09-06 DIAGNOSIS — R0689 Other abnormalities of breathing: Secondary | ICD-10-CM | POA: Diagnosis not present

## 2023-09-06 DIAGNOSIS — G9059 Complex regional pain syndrome I of other specified site: Secondary | ICD-10-CM | POA: Diagnosis present

## 2023-09-06 DIAGNOSIS — S72002A Fracture of unspecified part of neck of left femur, initial encounter for closed fracture: Secondary | ICD-10-CM | POA: Diagnosis not present

## 2023-09-06 DIAGNOSIS — I272 Pulmonary hypertension, unspecified: Secondary | ICD-10-CM | POA: Diagnosis not present

## 2023-09-06 DIAGNOSIS — S7292XA Unspecified fracture of left femur, initial encounter for closed fracture: Secondary | ICD-10-CM | POA: Diagnosis not present

## 2023-09-06 DIAGNOSIS — S72142A Displaced intertrochanteric fracture of left femur, initial encounter for closed fracture: Secondary | ICD-10-CM | POA: Diagnosis present

## 2023-09-06 DIAGNOSIS — Z85828 Personal history of other malignant neoplasm of skin: Secondary | ICD-10-CM | POA: Diagnosis not present

## 2023-09-06 DIAGNOSIS — I441 Atrioventricular block, second degree: Secondary | ICD-10-CM | POA: Diagnosis present

## 2023-09-06 DIAGNOSIS — W19XXXA Unspecified fall, initial encounter: Secondary | ICD-10-CM | POA: Diagnosis not present

## 2023-09-06 DIAGNOSIS — I480 Paroxysmal atrial fibrillation: Secondary | ICD-10-CM | POA: Diagnosis not present

## 2023-09-06 DIAGNOSIS — I129 Hypertensive chronic kidney disease with stage 1 through stage 4 chronic kidney disease, or unspecified chronic kidney disease: Secondary | ICD-10-CM | POA: Diagnosis present

## 2023-09-06 DIAGNOSIS — M1612 Unilateral primary osteoarthritis, left hip: Secondary | ICD-10-CM | POA: Diagnosis not present

## 2023-09-06 DIAGNOSIS — E538 Deficiency of other specified B group vitamins: Secondary | ICD-10-CM | POA: Diagnosis present

## 2023-09-06 DIAGNOSIS — Z87891 Personal history of nicotine dependence: Secondary | ICD-10-CM | POA: Diagnosis not present

## 2023-09-06 DIAGNOSIS — Z01810 Encounter for preprocedural cardiovascular examination: Secondary | ICD-10-CM | POA: Diagnosis not present

## 2023-09-06 DIAGNOSIS — R000 Tachycardia, unspecified: Secondary | ICD-10-CM | POA: Diagnosis not present

## 2023-09-06 DIAGNOSIS — S72002D Fracture of unspecified part of neck of left femur, subsequent encounter for closed fracture with routine healing: Secondary | ICD-10-CM | POA: Diagnosis not present

## 2023-09-06 DIAGNOSIS — I340 Nonrheumatic mitral (valve) insufficiency: Secondary | ICD-10-CM | POA: Diagnosis not present

## 2023-09-06 DIAGNOSIS — Z8639 Personal history of other endocrine, nutritional and metabolic disease: Secondary | ICD-10-CM | POA: Diagnosis not present

## 2023-09-06 DIAGNOSIS — Z66 Do not resuscitate: Secondary | ICD-10-CM | POA: Diagnosis present

## 2023-09-06 DIAGNOSIS — Z7901 Long term (current) use of anticoagulants: Secondary | ICD-10-CM | POA: Diagnosis not present

## 2023-09-06 DIAGNOSIS — E785 Hyperlipidemia, unspecified: Secondary | ICD-10-CM | POA: Diagnosis not present

## 2023-09-06 DIAGNOSIS — N183 Chronic kidney disease, stage 3 unspecified: Secondary | ICD-10-CM | POA: Diagnosis present

## 2023-09-06 DIAGNOSIS — S79912A Unspecified injury of left hip, initial encounter: Secondary | ICD-10-CM | POA: Diagnosis not present

## 2023-09-06 DIAGNOSIS — G9389 Other specified disorders of brain: Secondary | ICD-10-CM | POA: Diagnosis not present

## 2023-09-06 DIAGNOSIS — Z01811 Encounter for preprocedural respiratory examination: Secondary | ICD-10-CM | POA: Diagnosis not present

## 2023-09-06 DIAGNOSIS — Z79899 Other long term (current) drug therapy: Secondary | ICD-10-CM | POA: Diagnosis not present

## 2023-09-06 DIAGNOSIS — S0990XA Unspecified injury of head, initial encounter: Secondary | ICD-10-CM | POA: Diagnosis not present

## 2023-09-06 DIAGNOSIS — R918 Other nonspecific abnormal finding of lung field: Secondary | ICD-10-CM | POA: Diagnosis not present

## 2023-09-06 DIAGNOSIS — G629 Polyneuropathy, unspecified: Secondary | ICD-10-CM | POA: Diagnosis present

## 2023-09-06 DIAGNOSIS — I351 Nonrheumatic aortic (valve) insufficiency: Secondary | ICD-10-CM | POA: Diagnosis not present

## 2023-09-06 DIAGNOSIS — K219 Gastro-esophageal reflux disease without esophagitis: Secondary | ICD-10-CM | POA: Diagnosis present

## 2023-09-06 DIAGNOSIS — Z96652 Presence of left artificial knee joint: Secondary | ICD-10-CM | POA: Diagnosis not present

## 2023-09-06 DIAGNOSIS — M25552 Pain in left hip: Secondary | ICD-10-CM | POA: Diagnosis not present

## 2023-09-06 DIAGNOSIS — I714 Abdominal aortic aneurysm, without rupture, unspecified: Secondary | ICD-10-CM | POA: Diagnosis not present

## 2023-09-06 DIAGNOSIS — E041 Nontoxic single thyroid nodule: Secondary | ICD-10-CM | POA: Diagnosis present

## 2023-09-06 DIAGNOSIS — I6523 Occlusion and stenosis of bilateral carotid arteries: Secondary | ICD-10-CM | POA: Diagnosis not present

## 2023-09-06 DIAGNOSIS — I10 Essential (primary) hypertension: Secondary | ICD-10-CM | POA: Diagnosis not present

## 2023-09-06 DIAGNOSIS — Z8679 Personal history of other diseases of the circulatory system: Secondary | ICD-10-CM | POA: Diagnosis not present

## 2023-09-06 DIAGNOSIS — Z8719 Personal history of other diseases of the digestive system: Secondary | ICD-10-CM | POA: Diagnosis not present

## 2023-09-06 DIAGNOSIS — Z9181 History of falling: Secondary | ICD-10-CM | POA: Diagnosis not present

## 2023-09-07 DIAGNOSIS — Z8679 Personal history of other diseases of the circulatory system: Secondary | ICD-10-CM | POA: Diagnosis not present

## 2023-09-07 DIAGNOSIS — Z79899 Other long term (current) drug therapy: Secondary | ICD-10-CM | POA: Diagnosis not present

## 2023-09-07 DIAGNOSIS — Z8719 Personal history of other diseases of the digestive system: Secondary | ICD-10-CM | POA: Diagnosis not present

## 2023-09-07 DIAGNOSIS — I351 Nonrheumatic aortic (valve) insufficiency: Secondary | ICD-10-CM | POA: Diagnosis not present

## 2023-09-07 DIAGNOSIS — E785 Hyperlipidemia, unspecified: Secondary | ICD-10-CM | POA: Diagnosis present

## 2023-09-07 DIAGNOSIS — Z87891 Personal history of nicotine dependence: Secondary | ICD-10-CM | POA: Diagnosis not present

## 2023-09-07 DIAGNOSIS — Z01810 Encounter for preprocedural cardiovascular examination: Secondary | ICD-10-CM | POA: Diagnosis not present

## 2023-09-07 DIAGNOSIS — S72002D Fracture of unspecified part of neck of left femur, subsequent encounter for closed fracture with routine healing: Secondary | ICD-10-CM | POA: Diagnosis not present

## 2023-09-07 DIAGNOSIS — W19XXXA Unspecified fall, initial encounter: Secondary | ICD-10-CM | POA: Diagnosis not present

## 2023-09-07 DIAGNOSIS — E538 Deficiency of other specified B group vitamins: Secondary | ICD-10-CM | POA: Diagnosis present

## 2023-09-07 DIAGNOSIS — S72142A Displaced intertrochanteric fracture of left femur, initial encounter for closed fracture: Secondary | ICD-10-CM | POA: Diagnosis present

## 2023-09-07 DIAGNOSIS — Z01811 Encounter for preprocedural respiratory examination: Secondary | ICD-10-CM | POA: Diagnosis not present

## 2023-09-07 DIAGNOSIS — I480 Paroxysmal atrial fibrillation: Secondary | ICD-10-CM | POA: Diagnosis not present

## 2023-09-07 DIAGNOSIS — I4892 Unspecified atrial flutter: Secondary | ICD-10-CM | POA: Diagnosis present

## 2023-09-07 DIAGNOSIS — Z96652 Presence of left artificial knee joint: Secondary | ICD-10-CM | POA: Diagnosis not present

## 2023-09-07 DIAGNOSIS — Z66 Do not resuscitate: Secondary | ICD-10-CM | POA: Diagnosis present

## 2023-09-07 DIAGNOSIS — M1612 Unilateral primary osteoarthritis, left hip: Secondary | ICD-10-CM | POA: Diagnosis not present

## 2023-09-07 DIAGNOSIS — G9059 Complex regional pain syndrome I of other specified site: Secondary | ICD-10-CM | POA: Diagnosis present

## 2023-09-07 DIAGNOSIS — K219 Gastro-esophageal reflux disease without esophagitis: Secondary | ICD-10-CM | POA: Diagnosis present

## 2023-09-07 DIAGNOSIS — N183 Chronic kidney disease, stage 3 unspecified: Secondary | ICD-10-CM | POA: Diagnosis present

## 2023-09-07 DIAGNOSIS — I10 Essential (primary) hypertension: Secondary | ICD-10-CM | POA: Diagnosis not present

## 2023-09-07 DIAGNOSIS — I340 Nonrheumatic mitral (valve) insufficiency: Secondary | ICD-10-CM | POA: Diagnosis not present

## 2023-09-07 DIAGNOSIS — I441 Atrioventricular block, second degree: Secondary | ICD-10-CM | POA: Diagnosis present

## 2023-09-07 DIAGNOSIS — Z7901 Long term (current) use of anticoagulants: Secondary | ICD-10-CM | POA: Diagnosis not present

## 2023-09-07 DIAGNOSIS — I714 Abdominal aortic aneurysm, without rupture, unspecified: Secondary | ICD-10-CM | POA: Diagnosis present

## 2023-09-07 DIAGNOSIS — G629 Polyneuropathy, unspecified: Secondary | ICD-10-CM | POA: Diagnosis present

## 2023-09-07 DIAGNOSIS — S0990XA Unspecified injury of head, initial encounter: Secondary | ICD-10-CM | POA: Diagnosis not present

## 2023-09-07 DIAGNOSIS — I6523 Occlusion and stenosis of bilateral carotid arteries: Secondary | ICD-10-CM | POA: Diagnosis not present

## 2023-09-07 DIAGNOSIS — Z9181 History of falling: Secondary | ICD-10-CM | POA: Diagnosis not present

## 2023-09-07 DIAGNOSIS — Z85828 Personal history of other malignant neoplasm of skin: Secondary | ICD-10-CM | POA: Diagnosis not present

## 2023-09-07 DIAGNOSIS — E041 Nontoxic single thyroid nodule: Secondary | ICD-10-CM | POA: Diagnosis present

## 2023-09-07 DIAGNOSIS — Z8639 Personal history of other endocrine, nutritional and metabolic disease: Secondary | ICD-10-CM | POA: Diagnosis not present

## 2023-09-07 DIAGNOSIS — S72002A Fracture of unspecified part of neck of left femur, initial encounter for closed fracture: Secondary | ICD-10-CM | POA: Diagnosis not present

## 2023-09-07 DIAGNOSIS — R918 Other nonspecific abnormal finding of lung field: Secondary | ICD-10-CM | POA: Diagnosis not present

## 2023-09-07 DIAGNOSIS — I129 Hypertensive chronic kidney disease with stage 1 through stage 4 chronic kidney disease, or unspecified chronic kidney disease: Secondary | ICD-10-CM | POA: Diagnosis present

## 2023-09-07 DIAGNOSIS — G9389 Other specified disorders of brain: Secondary | ICD-10-CM | POA: Diagnosis not present

## 2023-09-07 DIAGNOSIS — S7292XA Unspecified fracture of left femur, initial encounter for closed fracture: Secondary | ICD-10-CM | POA: Diagnosis not present

## 2023-09-07 DIAGNOSIS — I272 Pulmonary hypertension, unspecified: Secondary | ICD-10-CM | POA: Diagnosis not present

## 2023-09-13 DIAGNOSIS — M6281 Muscle weakness (generalized): Secondary | ICD-10-CM | POA: Diagnosis not present

## 2023-09-13 DIAGNOSIS — K5900 Constipation, unspecified: Secondary | ICD-10-CM | POA: Diagnosis not present

## 2023-09-13 DIAGNOSIS — S7292XD Unspecified fracture of left femur, subsequent encounter for closed fracture with routine healing: Secondary | ICD-10-CM | POA: Diagnosis not present

## 2023-09-13 DIAGNOSIS — Z7901 Long term (current) use of anticoagulants: Secondary | ICD-10-CM | POA: Diagnosis not present

## 2023-09-18 DIAGNOSIS — Z299 Encounter for prophylactic measures, unspecified: Secondary | ICD-10-CM | POA: Diagnosis not present

## 2023-09-18 DIAGNOSIS — I4891 Unspecified atrial fibrillation: Secondary | ICD-10-CM | POA: Diagnosis not present

## 2023-09-18 DIAGNOSIS — S72002D Fracture of unspecified part of neck of left femur, subsequent encounter for closed fracture with routine healing: Secondary | ICD-10-CM | POA: Diagnosis not present

## 2023-09-18 DIAGNOSIS — I10 Essential (primary) hypertension: Secondary | ICD-10-CM | POA: Diagnosis not present

## 2023-09-20 DIAGNOSIS — R42 Dizziness and giddiness: Secondary | ICD-10-CM | POA: Diagnosis not present

## 2023-09-20 DIAGNOSIS — K5900 Constipation, unspecified: Secondary | ICD-10-CM | POA: Diagnosis not present

## 2023-09-20 DIAGNOSIS — S7292XD Unspecified fracture of left femur, subsequent encounter for closed fracture with routine healing: Secondary | ICD-10-CM | POA: Diagnosis not present

## 2023-09-20 DIAGNOSIS — D649 Anemia, unspecified: Secondary | ICD-10-CM | POA: Diagnosis not present

## 2023-09-23 DIAGNOSIS — S72002D Fracture of unspecified part of neck of left femur, subsequent encounter for closed fracture with routine healing: Secondary | ICD-10-CM | POA: Diagnosis not present

## 2023-09-25 DIAGNOSIS — N182 Chronic kidney disease, stage 2 (mild): Secondary | ICD-10-CM | POA: Diagnosis not present

## 2023-09-25 DIAGNOSIS — I10 Essential (primary) hypertension: Secondary | ICD-10-CM | POA: Diagnosis not present

## 2023-09-30 ENCOUNTER — Telehealth: Payer: Self-pay | Admitting: Cardiology

## 2023-09-30 NOTE — Telephone Encounter (Signed)
 Patient returned staff call and stated she has now moved to [place] and will no longer be seeing [facility].

## 2023-10-02 DIAGNOSIS — K5901 Slow transit constipation: Secondary | ICD-10-CM | POA: Diagnosis not present

## 2023-10-02 DIAGNOSIS — K219 Gastro-esophageal reflux disease without esophagitis: Secondary | ICD-10-CM | POA: Diagnosis not present

## 2023-11-27 ENCOUNTER — Other Ambulatory Visit: Payer: Self-pay | Admitting: Cardiology

## 2023-11-27 DIAGNOSIS — I4892 Unspecified atrial flutter: Secondary | ICD-10-CM

## 2023-11-27 NOTE — Telephone Encounter (Signed)
 Prescription refill request for Eliquis  received. Indication:afib Last office visit:10/24 Scr:0.71  2/25 Age: 83 Weight:93  kg  Prescription refilled
